# Patient Record
Sex: Female | Born: 1980 | Race: White | Hispanic: No | Smoking: Never smoker
Health system: Southern US, Community
[De-identification: ages and names within clinical notes are randomized; demographics above are authoritative.]

## PROBLEM LIST (undated history)

## (undated) DIAGNOSIS — I10 Essential (primary) hypertension: Secondary | ICD-10-CM

## (undated) DIAGNOSIS — F419 Anxiety disorder, unspecified: Secondary | ICD-10-CM

## (undated) DIAGNOSIS — C801 Malignant (primary) neoplasm, unspecified: Secondary | ICD-10-CM

## (undated) DIAGNOSIS — Z9889 Other specified postprocedural states: Secondary | ICD-10-CM

## (undated) DIAGNOSIS — R011 Cardiac murmur, unspecified: Secondary | ICD-10-CM

## (undated) DIAGNOSIS — F32A Depression, unspecified: Secondary | ICD-10-CM

## (undated) DIAGNOSIS — R112 Nausea with vomiting, unspecified: Secondary | ICD-10-CM

## (undated) DIAGNOSIS — J189 Pneumonia, unspecified organism: Secondary | ICD-10-CM

## (undated) HISTORY — PX: NECK DISSECTION: SUR422

## (undated) HISTORY — PX: THYROIDECTOMY: SHX17

---

## 2003-12-17 HISTORY — PX: CHOLECYSTECTOMY: SHX55

## 2007-11-18 ENCOUNTER — Ambulatory Visit: Payer: Self-pay | Admitting: Cardiology

## 2010-12-26 ENCOUNTER — Encounter
Admission: RE | Admit: 2010-12-26 | Discharge: 2010-12-26 | Payer: Self-pay | Source: Home / Self Care | Attending: Surgery | Admitting: Surgery

## 2011-01-22 ENCOUNTER — Encounter (HOSPITAL_COMMUNITY)
Admission: RE | Admit: 2011-01-22 | Discharge: 2011-01-22 | Disposition: A | Discharge status: Home / Self Care | Payer: PRIVATE HEALTH INSURANCE | Source: Ambulatory Visit | Attending: Otolaryngology | Admitting: Otolaryngology

## 2011-01-22 ENCOUNTER — Other Ambulatory Visit (HOSPITAL_COMMUNITY): Payer: Self-pay | Admitting: Otolaryngology

## 2011-01-22 ENCOUNTER — Ambulatory Visit (HOSPITAL_COMMUNITY)
Admission: RE | Admit: 2011-01-22 | Discharge: 2011-01-22 | Disposition: A | Discharge status: Home / Self Care | Payer: PRIVATE HEALTH INSURANCE | Source: Ambulatory Visit | Attending: Otolaryngology | Admitting: Otolaryngology

## 2011-01-22 DIAGNOSIS — E0590 Thyrotoxicosis, unspecified without thyrotoxic crisis or storm: Secondary | ICD-10-CM

## 2011-01-22 DIAGNOSIS — Z01818 Encounter for other preprocedural examination: Secondary | ICD-10-CM | POA: Insufficient documentation

## 2011-01-22 LAB — SURGICAL PCR SCREEN
MRSA, PCR: NEGATIVE
Staphylococcus aureus: NEGATIVE

## 2011-01-22 LAB — CBC
Hemoglobin: 13.7 g/dL (ref 12.0–15.0)
MCH: 30.9 pg (ref 26.0–34.0)
MCHC: 34.3 g/dL (ref 30.0–36.0)
Platelets: 350 10*3/uL (ref 150–400)
RBC: 4.44 MIL/uL (ref 3.87–5.11)

## 2011-01-22 LAB — BASIC METABOLIC PANEL
BUN: 10 mg/dL (ref 6–23)
Creatinine, Ser: 0.68 mg/dL (ref 0.4–1.2)
GFR calc Af Amer: >60 mL/min (ref >60)
GFR calc non Af Amer: >60 mL/min (ref >60)
Potassium: 4.4 mEq/L (ref 3.5–5.1)

## 2011-01-22 LAB — HCG, SERUM, QUALITATIVE: Preg, Serum: NEGATIVE

## 2011-01-24 ENCOUNTER — Inpatient Hospital Stay (HOSPITAL_COMMUNITY)
Admission: RE | Admit: 2011-01-24 | Discharge: 2011-01-28 | DRG: 626 | Disposition: A | Discharge status: Home / Self Care | Payer: PRIVATE HEALTH INSURANCE | Attending: Otolaryngology | Admitting: Otolaryngology

## 2011-01-24 ENCOUNTER — Other Ambulatory Visit: Payer: Self-pay | Admitting: Otolaryngology

## 2011-01-24 DIAGNOSIS — I10 Essential (primary) hypertension: Secondary | ICD-10-CM | POA: Diagnosis present

## 2011-01-24 DIAGNOSIS — C50919 Malignant neoplasm of unspecified site of unspecified female breast: Secondary | ICD-10-CM | POA: Diagnosis present

## 2011-01-24 DIAGNOSIS — C73 Malignant neoplasm of thyroid gland: Principal | ICD-10-CM | POA: Diagnosis present

## 2011-01-25 LAB — CALCIUM
Calcium: 8.4 mg/dL (ref 8.4–10.5)
Calcium: 8.4 mg/dL (ref 8.4–10.5)

## 2011-01-25 LAB — CBC
MCH: 29.8 pg (ref 26.0–34.0)
MCV: 89.2 fL (ref 78.0–100.0)
RDW: 13.3 % (ref 11.5–15.5)

## 2011-01-26 LAB — CALCIUM
Calcium: 8.4 mg/dL (ref 8.4–10.5)
Calcium: 8.3 mg/dL — ABNORMAL LOW (ref 8.4–10.5)

## 2011-01-28 NOTE — Op Note (Signed)
Sabrina [name], Sabrina [name]                ACCOUNT NO.:  [id number]  MEDICAL RECORD NO.:  [id number]           PATIENT TYPE:  I  LOCATION:  5531                         FACILITY:  [facility]  PHYSICIAN:  Kaylob Wallen H. 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  PROCEDURE:  Total thyroidectomy, anterior compartment dissection, and right modified radical neck dissection including levels 2 through 5.  General endotracheal anesthesia was used.  No complications.  Blood loss 50 mL.  FINDINGS:  Multiple hard nodules within both lobes of the thyroid more so on the right.  Multiple large firm nodes on the right including agglomeration of large nodes in the supraclavicular fossa and multiple nodes up along the jugular chain in levels 2 through 4.  HISTORY:  A 30 year old presented with a right lower neck mass.  CT evaluation, FNA, and ultrasound revealed metastatic papillary thyroid carcinoma.  Risks, benefits, alternative, and complications of procedure were explained to the patient who seemed understand and agreed to surgery.  PROCEDURE:  The patient was taken to the operating room and placed on the operating table in supine position.  Following induction of general endotracheal anesthesia, the patient was prepped and draped in a standard fashion.  A marking pen was used to outline an incision transverse just above the clavicle with an extension up to the right mastoid tip.  The electrocautery was used to incise the skin and subcutaneous tissue and then through the platysma layer.  Subplatysmal flaps were developed superiorly to the mandible and inferiorly to the clavicle. 1. Total thyroidectomy.  The left lobe was dissected first.   The lobe     was retracted medially while the superior pole was exposed.  The     strap muscles were reflected laterally.  The superior vasculature     was ligated between clamps and divided.  The dissection remained     right on the capsule of the thyroid.  Parathyroids were not     separately identified.  The recurrent nerve was also not identified     during the dissection.  The middle thyroid vein was ligated between     clamps and divided.  The inferior vasculature was also ligated     between clamps and divided and gland was brought forward.  The     ligament of Allyson Sabal was divided using electrocautery, and the gland     was brought off the trachea.  A similar dissection was then     accomplished on the right side.  The right side had much larger     firm nodules including one deep one that extended back into the     region where the recurrent nerve would normally sit as it enters     the larynx.  I did not see the nerve in this area but further     inspection after the lobe was  dissected out revealed what appeared     to be the nerve in its normal location and intact.  Again,     parathyroids were not separately identified on this side.  The     thyroid was sent for pathologic evaluation with a suture marking     the right lobe.  1. Anterior compartment dissection.  After the thyroidectomy was     completed, the anterior compartment was palpated and there was 1 or     2 enlarged nodes that were palpable.  The dissection was then     accomplished taking care to ligate any vessels that were in the     region between clamps and then dividing them.  The dissection     stayed anterior to the trachea to protect the recurrent nerves and     also the vasculature of the parathyroids.  The fibrofatty tissue     with lymph nodes was then dissected out and sent for pathologic     evaluation, marked as central node compartment dissection.  1. Right modified radical neck dissection.  The right  neck dissection     was then accomplished.  The dissection encompassed the fibrofatty     lymph node bearing tissue involving the levels 2 through 5.  The     following structures were identified and preserved:  The common     carotid, external and internal carotids, the vagus nerve, the     internal jugular vein, the spinal accessory nerve, and the     hypoglossal nerve.  The omohyoid muscle was transected and the     external jugular vein wire was removed.  What appeared to be a     lymphatic duct in the lower jugular was identified, ligated between     clamps, and divided.  Complete dissection was accomplished.  There     were multiple lymph nodes identified.  The dissection was brought     off the strap muscles anteriorly, off the clavicle inferiorly, and     removing the supraclavicular nodes involved significant traction on     the fatty tissue and the supraclavicular fat fossa that may have     included some axillary fat.  The superior limit of dissection was     the submandibular gland and the mastoid tip and angle of the     mandible.  The posterosuperior limits were the splenius capitis and     levator scapula.  Posterior limit was the upper level 5 fibrofatty     tissue where there were no nodes palpable and inferiorly was the     fascia of the scalene muscles and the associated brachial plexus     nerves.  The specimen was sent in 2 parts, one part being the     levels 2, 3, and 4 that were marked with marking sutures on levels     2 and 3 and a separate specimen from level 5 with the large mass of     nodes.  The wound was irrigated with saline.  There was no evidence     of chyle leak.  A 15-French round JP drain was left in the wound,     exited through a separate stab incision, and secured in place with     a nylon suture.  The wound was closed with 3-0 chromic in the     platysma layer and staples on the skin.  Bacitracin was applied.  The patient was then awakened,  extubated, and transferred to     recovery in stable condition.     Michalla Ringer H. Pollyann Kennedy, MD     JHR/MEDQ  D:  01/24/2011  T:  01/25/2011  Job:  [id number]  Electronically Signed by Serena Colonel MD on 01/28/2011 09:36:46 PM

## 2011-02-05 NOTE — Discharge Summary (Signed)
  [name], [name]                ACCOUNT NO.:  [id number]  MEDICAL RECORD NO.:  [id number]           PATIENT TYPE:  I  LOCATION:  5531                         FACILITY:  [facility]  PHYSICIAN:  Loyalty Arentz H. Pollyann Kennedy, MD     DATE OF BIRTH:  Aug 30, 1981  DATE OF ADMISSION:  01/24/2011 DATE OF DISCHARGE:  01/28/2011                              DISCHARGE SUMMARY   ADMISSION DIAGNOSIS:  Metastatic papillary thyroid carcinoma.  DISCHARGE DIAGNOSES:  Status post right modified radical neck dissection and total thyroidectomy with central node compartment dissection.  HISTORY:  A 30 year old was found to have a metastatic papillary thyroid carcinoma and a supraclavicular node.  Imaging revealed diffuse adenopathy on the right and bilateral thyroid nodules.  The right side was a worse and there were calcifications as well.  She was admitted to the hospital on February 9 where she underwent the above-mentioned operative procedure.  She was transferred to the surgical floor postoperatively in good condition.  There was a drain left in place. Her hospital course was uncomplicated.  The drain was removed on the day of discharge.  Her voice remained normal.  Her swallowing ability was normal and her calcium was remained normal.  She is discharged to home with staples in her skin in good condition, instructed to keep the incision clean and dry and apply antibiotic ointment twice daily.  She will follow up with me at the end of the week for staple removal. Pathology was still pending at the time of discharge.     Sabrina [name] H. Pollyann Kennedy, MD     JHR/MEDQ  D:  01/28/2011  T:  01/29/2011  Job:  [id number]  Electronically Signed by Serena Colonel MD on 02/05/2011 11:08:48 AM

## 2011-02-18 ENCOUNTER — Other Ambulatory Visit (HOSPITAL_COMMUNITY): Payer: Self-pay | Admitting: Endocrinology

## 2011-02-18 DIAGNOSIS — C73 Malignant neoplasm of thyroid gland: Secondary | ICD-10-CM

## 2011-03-04 ENCOUNTER — Encounter (HOSPITAL_COMMUNITY)
Admission: RE | Admit: 2011-03-04 | Discharge: 2011-03-04 | Disposition: A | Discharge status: Home / Self Care | Payer: PRIVATE HEALTH INSURANCE | Source: Ambulatory Visit | Attending: Endocrinology | Admitting: Endocrinology

## 2011-03-04 DIAGNOSIS — C73 Malignant neoplasm of thyroid gland: Secondary | ICD-10-CM | POA: Insufficient documentation

## 2011-03-05 ENCOUNTER — Ambulatory Visit (HOSPITAL_COMMUNITY): Payer: PRIVATE HEALTH INSURANCE

## 2011-03-06 ENCOUNTER — Ambulatory Visit (HOSPITAL_COMMUNITY)
Admission: RE | Admit: 2011-03-06 | Discharge: 2011-03-06 | Disposition: A | Discharge status: Home / Self Care | Payer: PRIVATE HEALTH INSURANCE | Source: Ambulatory Visit | Attending: Endocrinology | Admitting: Endocrinology

## 2011-03-06 LAB — HCG, SERUM, QUALITATIVE: Preg, Serum: NEGATIVE

## 2011-03-06 MED ORDER — SODIUM IODIDE I 131 CAPSULE: 130.0000 | Freq: Once | INTRAVENOUS | Status: AC | PRN

## 2011-03-15 ENCOUNTER — Encounter (HOSPITAL_COMMUNITY)
Admission: RE | Admit: 2011-03-15 | Discharge: 2011-03-15 | Disposition: A | Discharge status: Home / Self Care | Payer: PRIVATE HEALTH INSURANCE | Source: Ambulatory Visit | Attending: Endocrinology | Admitting: Endocrinology

## 2011-03-15 ENCOUNTER — Encounter (HOSPITAL_COMMUNITY): Payer: Self-pay

## 2011-03-15 DIAGNOSIS — C73 Malignant neoplasm of thyroid gland: Secondary | ICD-10-CM | POA: Insufficient documentation

## 2011-03-15 HISTORY — DX: Malignant (primary) neoplasm, unspecified: C80.1

## 2013-10-18 ENCOUNTER — Other Ambulatory Visit (HOSPITAL_COMMUNITY): Payer: Self-pay | Admitting: Endocrinology

## 2013-10-18 DIAGNOSIS — C73 Malignant neoplasm of thyroid gland: Secondary | ICD-10-CM

## 2013-10-26 ENCOUNTER — Other Ambulatory Visit (HOSPITAL_COMMUNITY): Payer: PRIVATE HEALTH INSURANCE

## 2013-10-27 ENCOUNTER — Encounter (HOSPITAL_COMMUNITY)
Admission: RE | Admit: 2013-10-27 | Discharge: 2013-10-27 | Disposition: A | Discharge status: Home / Self Care | Payer: PRIVATE HEALTH INSURANCE | Source: Ambulatory Visit | Attending: Endocrinology | Admitting: Endocrinology

## 2013-10-27 DIAGNOSIS — C73 Malignant neoplasm of thyroid gland: Secondary | ICD-10-CM | POA: Insufficient documentation

## 2013-10-27 IMAGING — PT NM PET TUM IMG RESTAG (PS) SKULL BASE T - THIGH
6 series · 25 of 25 positions shown · non-contrast
Comparison: Whole-body scan iodine [DATE]

CLINICAL DATA: Subsequent treatment strategy for thyroid
carcinoma.. Patient with papillary thyroid carcinoma. Patient
received adjuvant radiotherapy in [DATE] with 130 mCi I 131.

EXAM:
NUCLEAR MEDICINE PET SKULL BASE TO THIGH
FASTING BLOOD GLUCOSE:  Value: 101mg/dl
TECHNIQUE: 17.7 mCi F-18 FDG was injected intravenously. CT data was obtained
and used for attenuation correction and anatomic localization only.
(This was not acquired as a diagnostic CT examination.) Additional
exam technical data entered on technologist worksheet.

[Series 1: pet ac · axial · 3.3mm · 4.69mm/px · z∈[-928,-58]mm · 5 of 267 slices shown]
[im 1/267]
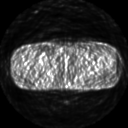
[im 67/267]
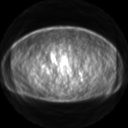
[im 134/267]
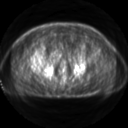
[im 200/267]
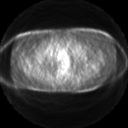
[im 267/267]
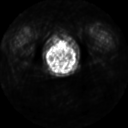

[Series 2: ct images · axial · 3.8mm · 0.98mm/px · z∈[-928,-58]mm · 5 of 261 slices shown]
[im 1/261]
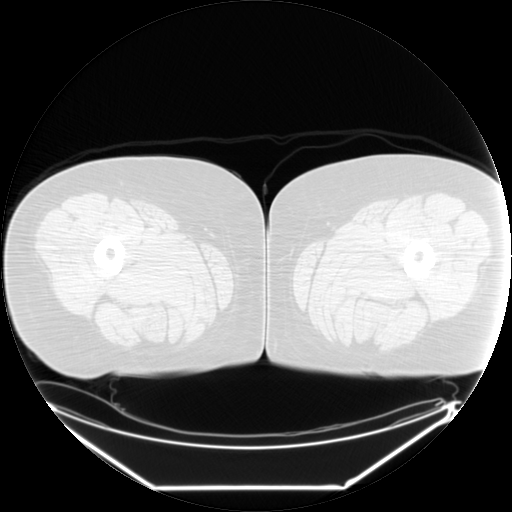
[im 66/261]
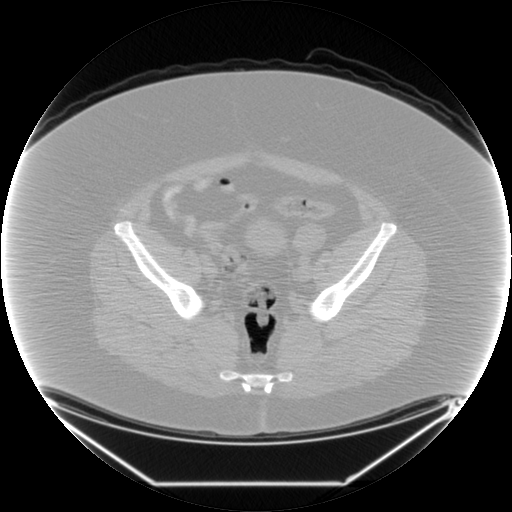
[im 131/261]
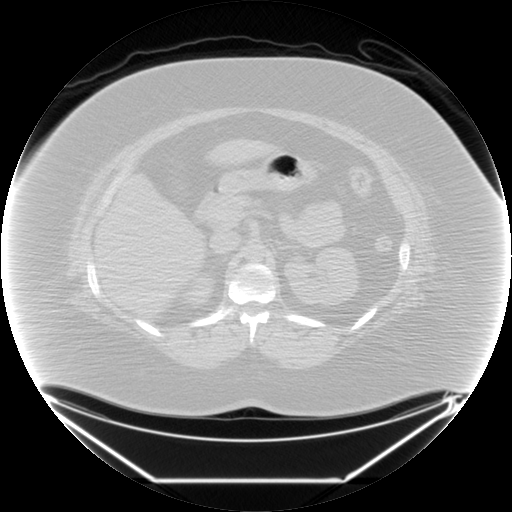
[im 196/261]
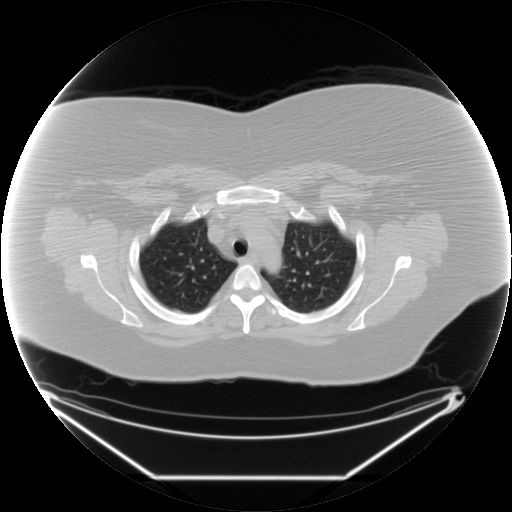
[im 261/261  brain]
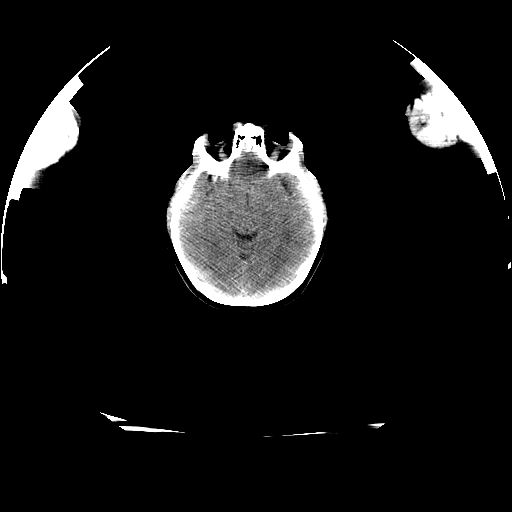

[Series 2: pet nac · axial · 3.3mm · 4.69mm/px · z∈[-928,-58]mm · 6 of 267 slices shown]
[im 1/267]
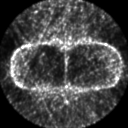
[im 54/267]
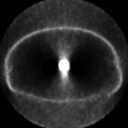
[im 107/267]
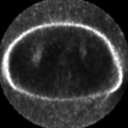
[im 160/267]
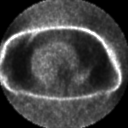
[im 213/267]
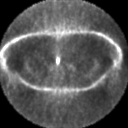
[im 267/267]
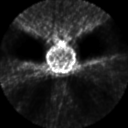

[Series 123: mip · coronal · 3.3mm · 4.69mm/px · 1 of 30 slices shown]
[im 1/30]
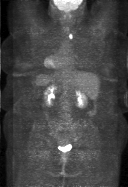

[Series 151: reformatted · axial · 3.3mm · 3.91mm/px · z∈[-928,-58]mm · 6 of 265 slices shown (1 of 2)]
[im 1/265]
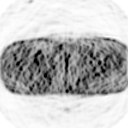
[im 53/265]
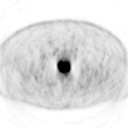
[im 106/265]
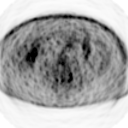
[im 159/265]
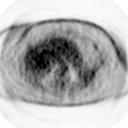
[im 212/265]
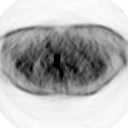
[im 265/265]
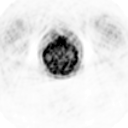

[Series 153: reformatted · coronal · 4.7mm · 6.98mm/px · 2 of 85 slices shown (2 of 2)]
[im 1/85]
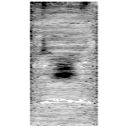
[im 85/85]
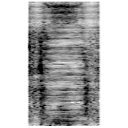

[25 of 25 positions shown; findings below may reference images not displayed]

FINDINGS: NECK

There is a single intense hypermetabolic nodule in the lower right
neck just inferior to the thyroid bed and behind the medial aspect
of the right clavicle. On the CT portion nodule measures 14 x 22 mm
(image 56). This nodule has intense metabolic activity within show
CT max 26.0 There are no additional hypermetabolic nodules in the
thyroid bed. No hypermetabolic cervical lymph nodes.

CHEST

No hypermetabolic mediastinal nodes. No suspicious pulmonary
nodules.

ABDOMEN/PELVIS

No abnormal hypermetabolic activity within the liver, pancreas,
adrenal glands, or spleen. No hypermetabolic lymph nodes in the
abdomen or pelvis.

SKELETON

No focal hypermetabolic activity to suggest skeletal metastasis.
IMPRESSION: 1. Hypermetabolic 2.2 x 1.4 cm nodule inferior to the right thyroid
bed is consistent with thyroid cancer recurrence. This nodule would
likely be amenable to resection.

2. No additional evidence of thyroid cancer metastasis on the
whole-body scan.

## 2013-10-27 MED ORDER — FLUDEOXYGLUCOSE F - 18 (FDG) INJECTION
17.7000 | Freq: Once | INTRAVENOUS | Status: AC | PRN
  Administered 2013-10-27: 17.7 via INTRAVENOUS

## 2013-11-15 NOTE — H&P (Signed)
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 Discussed  Status post total thyroidectomy and right neck dissection about 2 years ago for papillary cancer, there was a positive margin inferiorly on the right thyroid and multiple positive nodes. She had treatment with radioactive iodine. She has had low-level thyroglobulin and on recent PET scan was found to have a positive node in the right peritracheal region. Iodine scans have been negative. She has no symptoms. On examination, her voice is normal. Cords move well. No palpable neck masses. I have reviewed the PET scan. This is amenable to surgical resection however the recurrent nerve is going to be at high risk. We had a lengthy discussion about what that means. Nonetheless, surgical excision is the recommended treatment. We will set this up at her convenience. Reason For Visit  Possible thyroid cancer. Allergies  Penicillins Sulfa Drugs. Current Meds  Metoprolol Tartrate TABS;; RPT Voltaren TBEC (Diclofenac Sodium);; RPT Levothyroxine Sodium TABS;; RPT Citalopram Hydrobromide TABS;; RPT. Active Problems  Hypertension   (401.9) (I10) MALIG NEO LYMPH NODES*   (196) MALIGN NEOPL THYROID   (193). Papillary thyroid carcinoma (193) (C73). PSH  Cesarean Section Cholecystectomy Thyroid Surgery 2012;  * with right neck dissection, 01 Nov 2013. Family Hx  Family history of cardiac disorder: Father,Grandfather (V17.49) (Z82.49) Family history of hearing loss: Father,Grandmother (V19.2) (Z82.2) Family history of hypertension: Father,Grandfather (V17.49) (Z82.49) No pertinent family history: Mother. Personal Hx  Caffeine use (V49.89) (F15.929); 3 cups daily Never smoker (V49.89) (Z78.9) No alcohol use. ROS  Systemic: Feeling tired (fatigue).  No fever.  Night sweats.  No recent weight loss. Head: Headache. Eyes: No eye  symptoms. Otolaryngeal: No hearing loss, no earache, no tinnitus, and no purulent nasal discharge.  No nasal passage blockage (stuffiness).  Snoring.  No sneezing, no hoarseness, and no sore throat. Cardiovascular: No chest pain or discomfort  and no palpitations. Pulmonary: No dyspnea, no cough, and no wheezing. Gastrointestinal: No dysphagia  and no heartburn.  No nausea, no abdominal pain, and no melena.  No diarrhea. Genitourinary: No dysuria. Endocrine: No muscle weakness. Musculoskeletal: No calf muscle cramps, no arthralgias, and no soft tissue swelling. Neurological: No dizziness, no fainting, no tingling, and no numbness. Psychological: Anxiety  and depression. Skin: No rash. Vital Signs   Recorded by [facility] on 01 Nov 2013 03:19 PM BP:140/80,  Height: 64 in, Weight: 328 lb , BMI: 56.3 kg/m2,  BMI Calculated: 56.30 ,  BSA Calculated: 2.41. Signature  Electronically signed by : Serena Colonel  M.D.; 11/02/2013 9:01 AM EST. Electronically signed by : Serena Colonel  M.D.; 11/08/2013 3:43 PM EST.

## 2013-11-18 ENCOUNTER — Encounter (HOSPITAL_COMMUNITY): Payer: Self-pay

## 2013-11-18 ENCOUNTER — Encounter (HOSPITAL_COMMUNITY)
Admission: RE | Admit: 2013-11-18 | Discharge: 2013-11-18 | Disposition: A | Discharge status: Home / Self Care | Payer: PRIVATE HEALTH INSURANCE | Source: Ambulatory Visit | Attending: Otolaryngology | Admitting: Otolaryngology

## 2013-11-18 DIAGNOSIS — Z01818 Encounter for other preprocedural examination: Secondary | ICD-10-CM | POA: Insufficient documentation

## 2013-11-18 DIAGNOSIS — Z01812 Encounter for preprocedural laboratory examination: Secondary | ICD-10-CM | POA: Insufficient documentation

## 2013-11-18 HISTORY — DX: Anxiety disorder, unspecified: F41.9

## 2013-11-18 LAB — BASIC METABOLIC PANEL
Calcium: 9.1 mg/dL (ref 8.4–10.5)
Chloride: 104 mEq/L (ref 96–112)
Creatinine, Ser: 0.65 mg/dL (ref 0.50–1.10)
GFR calc Af Amer: >90 mL/min (ref >90)
GFR calc non Af Amer: >90 mL/min (ref >90)
Sodium: 140 mEq/L (ref 135–145)

## 2013-11-18 LAB — CBC
HCT: 40.2 % (ref 36.0–46.0)
MCHC: 33.3 g/dL (ref 30.0–36.0)
Platelets: 343 10*3/uL (ref 150–400)
RBC: 4.43 MIL/uL (ref 3.87–5.11)
RDW: 12.9 % (ref 11.5–15.5)
WBC: 7.4 10*3/uL (ref 4.0–10.5)

## 2013-11-18 NOTE — Pre-Procedure Instructions (Signed)
Darlen Gledhill Khiev  11/18/2013   Your procedure is scheduled on:  Wednesday November 24, 2013.  Report to  [facility] Short Stay Entrance "A" Admitting at 6:30 AM.  Call this number if you have problems the morning of surgery: [phone]   Remember:  STOP NSAIDS(VOLTAREN), VITAMINS, AND HERBAL MEDICATIONS 5 DAYS PRIOR TO SURGERY.   Do not eat food or drink liquids after midnight.   Take these medicines the morning of surgery with A SIP OF WATER: None   Do not wear jewelry, make-up or nail polish.  Do not wear lotions, powders, or perfumes. You may wear deodorant.  Do not shave 48 hours prior to surgery.   Do not bring valuables to the hospital.  [facility] is not responsible for any belongings or valuables.               Contacts, dentures or bridgework may not be worn into surgery.  Leave suitcase in the car. After surgery it may be brought to your room.  For patients admitted to the hospital, discharge time is determined by your treatment team.               Patients discharged the day of surgery will not be allowed to drive home.  Name and phone number of your driver: Family/Friend  Special Instructions: Shower using CHG 2 nights before surgery and the night before surgery.  If you shower the day of surgery use CHG.  Use special wash - you have one bottle of CHG for all showers.  You should use approximately 1/3 of the bottle for each shower.   Please read over the following fact sheets that you were given: Pain Booklet, Coughing and Deep Breathing and Surgical Site Infection Prevention

## 2013-11-23 MED ORDER — VANCOMYCIN HCL 10 G IV SOLR
1500.0000 mg | INTRAVENOUS | Status: AC
  Administered 2013-11-24: 1500 mg via INTRAVENOUS
  Filled 2013-11-23: qty 1500

## 2013-11-24 ENCOUNTER — Ambulatory Visit (HOSPITAL_COMMUNITY): Payer: PRIVATE HEALTH INSURANCE | Admitting: Anesthesiology

## 2013-11-24 ENCOUNTER — Encounter (HOSPITAL_COMMUNITY)
Admission: RE | Disposition: A | Discharge status: Home / Self Care | Payer: Self-pay | Source: Ambulatory Visit | Attending: Otolaryngology

## 2013-11-24 ENCOUNTER — Encounter (HOSPITAL_COMMUNITY): Payer: PRIVATE HEALTH INSURANCE | Admitting: Anesthesiology

## 2013-11-24 ENCOUNTER — Observation Stay (HOSPITAL_COMMUNITY)
Admission: RE | Admit: 2013-11-24 | Discharge: 2013-11-25 | Disposition: A | Discharge status: Home / Self Care | Payer: PRIVATE HEALTH INSURANCE | Source: Ambulatory Visit | Attending: Otolaryngology | Admitting: Otolaryngology

## 2013-11-24 ENCOUNTER — Encounter (HOSPITAL_COMMUNITY): Payer: Self-pay | Admitting: *Deleted

## 2013-11-24 DIAGNOSIS — I10 Essential (primary) hypertension: Secondary | ICD-10-CM | POA: Insufficient documentation

## 2013-11-24 DIAGNOSIS — Z789 Other specified health status: Secondary | ICD-10-CM | POA: Insufficient documentation

## 2013-11-24 DIAGNOSIS — E049 Nontoxic goiter, unspecified: Principal | ICD-10-CM | POA: Insufficient documentation

## 2013-11-24 DIAGNOSIS — Z9089 Acquired absence of other organs: Secondary | ICD-10-CM | POA: Insufficient documentation

## 2013-11-24 DIAGNOSIS — C73 Malignant neoplasm of thyroid gland: Secondary | ICD-10-CM | POA: Diagnosis present

## 2013-11-24 DIAGNOSIS — Z880 Allergy status to penicillin: Secondary | ICD-10-CM | POA: Insufficient documentation

## 2013-11-24 DIAGNOSIS — C770 Secondary and unspecified malignant neoplasm of lymph nodes of head, face and neck: Secondary | ICD-10-CM | POA: Insufficient documentation

## 2013-11-24 HISTORY — DX: Pneumonia, unspecified organism: J18.9

## 2013-11-24 HISTORY — DX: Nausea with vomiting, unspecified: R11.2

## 2013-11-24 HISTORY — DX: Cardiac murmur, unspecified: R01.1

## 2013-11-24 HISTORY — DX: Essential (primary) hypertension: I10

## 2013-11-24 HISTORY — PX: MASS EXCISION: SHX2000

## 2013-11-24 HISTORY — DX: Other specified postprocedural states: Z98.890

## 2013-11-24 LAB — CALCIUM
Calcium: 8.6 mg/dL (ref 8.4–10.5)
Calcium: 8.3 mg/dL — ABNORMAL LOW (ref 8.4–10.5)

## 2013-11-24 SURGERY — EXCISION MASS
Anesthesia: General | Site: Neck | Laterality: Right

## 2013-11-24 MED ORDER — OXYCODONE HCL 5 MG PO TABS: 5.0000 mg | ORAL_TABLET | Freq: Once | ORAL | Status: DC | PRN

## 2013-11-24 MED ORDER — PROMETHAZINE HCL 25 MG/ML IJ SOLN
6.2500 mg | INTRAMUSCULAR | Status: DC | PRN
  Administered 2013-11-24: 6.25 mg via INTRAVENOUS

## 2013-11-24 MED ORDER — ROCURONIUM BROMIDE 100 MG/10ML IV SOLN
INTRAVENOUS | Status: DC | PRN
  Administered 2013-11-24: 50 mg via INTRAVENOUS

## 2013-11-24 MED ORDER — OXYCODONE HCL 5 MG/5ML PO SOLN: 5.0000 mg | Freq: Once | ORAL | Status: DC | PRN

## 2013-11-24 MED ORDER — NEOSTIGMINE METHYLSULFATE 1 MG/ML IJ SOLN
INTRAMUSCULAR | Status: DC | PRN
  Administered 2013-11-24: 3 mg via INTRAVENOUS

## 2013-11-24 MED ORDER — PROMETHAZINE HCL 25 MG PO TABS: 25.0000 mg | ORAL_TABLET | Freq: Four times a day (QID) | ORAL | Status: DC | PRN

## 2013-11-24 MED ORDER — PROMETHAZINE HCL 25 MG/ML IJ SOLN
INTRAMUSCULAR | Status: AC
  Filled 2013-11-24: qty 1

## 2013-11-24 MED ORDER — CLONAZEPAM 0.5 MG PO TABS: 0.2500 mg | ORAL_TABLET | Freq: Two times a day (BID) | ORAL | Status: DC | PRN

## 2013-11-24 MED ORDER — ONDANSETRON HCL 4 MG/2ML IJ SOLN
INTRAMUSCULAR | Status: DC | PRN
  Administered 2013-11-24: 4 mg via INTRAVENOUS

## 2013-11-24 MED ORDER — HYDROMORPHONE HCL PF 1 MG/ML IJ SOLN: 0.2500 mg | INTRAMUSCULAR | Status: DC | PRN

## 2013-11-24 MED ORDER — FENTANYL CITRATE 0.05 MG/ML IJ SOLN
INTRAMUSCULAR | Status: DC | PRN
  Administered 2013-11-24: 50 ug via INTRAVENOUS
  Administered 2013-11-24: 100 ug via INTRAVENOUS
  Administered 2013-11-24 (×2): 50 ug via INTRAVENOUS

## 2013-11-24 MED ORDER — BACITRACIN ZINC 500 UNIT/GM EX OINT
TOPICAL_OINTMENT | CUTANEOUS | Status: AC
  Filled 2013-11-24: qty 15

## 2013-11-24 MED ORDER — 0.9 % SODIUM CHLORIDE (POUR BTL) OPTIME
TOPICAL | Status: DC | PRN
  Administered 2013-11-24: 1000 mL

## 2013-11-24 MED ORDER — DEXTROSE-NACL 5-0.9 % IV SOLN
INTRAVENOUS | Status: DC
  Administered 2013-11-24: 16:00:00 via INTRAVENOUS

## 2013-11-24 MED ORDER — LACTATED RINGERS IV SOLN
INTRAVENOUS | Status: DC | PRN
  Administered 2013-11-24 (×2): via INTRAVENOUS

## 2013-11-24 MED ORDER — CHOLECALCIFEROL 10 MCG (400 UNIT) PO TABS
800.0000 [IU] | ORAL_TABLET | Freq: Every day | ORAL | Status: DC
  Administered 2013-11-24 – 2013-11-25 (×2): 800 [IU] via ORAL
  Filled 2013-11-24 (×2): qty 2

## 2013-11-24 MED ORDER — HYDROCODONE-ACETAMINOPHEN 5-325 MG PO TABS
1.0000 | ORAL_TABLET | ORAL | Status: DC | PRN
  Administered 2013-11-24 – 2013-11-25 (×4): 2 via ORAL
  Filled 2013-11-24 (×4): qty 2

## 2013-11-24 MED ORDER — DICLOFENAC SODIUM 75 MG PO TBEC
75.0000 mg | DELAYED_RELEASE_TABLET | Freq: Two times a day (BID) | ORAL | Status: DC | PRN
  Filled 2013-11-24: qty 1

## 2013-11-24 MED ORDER — MIDAZOLAM HCL 5 MG/5ML IJ SOLN
INTRAMUSCULAR | Status: DC | PRN
  Administered 2013-11-24: 2 mg via INTRAVENOUS

## 2013-11-24 MED ORDER — LIDOCAINE-EPINEPHRINE 1 %-1:100000 IJ SOLN
INTRAMUSCULAR | Status: AC
  Filled 2013-11-24: qty 1

## 2013-11-24 MED ORDER — PROMETHAZINE HCL 25 MG RE SUPP: 25.0000 mg | Freq: Four times a day (QID) | RECTAL | Status: DC | PRN

## 2013-11-24 MED ORDER — PROPOFOL 10 MG/ML IV BOLUS
INTRAVENOUS | Status: DC | PRN
  Administered 2013-11-24: 150 mg via INTRAVENOUS

## 2013-11-24 MED ORDER — LIDOCAINE HCL (CARDIAC) 20 MG/ML IV SOLN
INTRAVENOUS | Status: DC | PRN
  Administered 2013-11-24: 50 mg via INTRAVENOUS

## 2013-11-24 MED ORDER — VITAMIN B-12 1000 MCG PO TABS
1000.0000 ug | ORAL_TABLET | Freq: Every day | ORAL | Status: DC
  Administered 2013-11-24 – 2013-11-25 (×2): 1000 ug via ORAL
  Filled 2013-11-24 (×2): qty 1

## 2013-11-24 MED ORDER — GLYCOPYRROLATE 0.2 MG/ML IJ SOLN
INTRAMUSCULAR | Status: DC | PRN
  Administered 2013-11-24: 0.4 mg via INTRAVENOUS

## 2013-11-24 MED ORDER — CITALOPRAM HYDROBROMIDE 20 MG PO TABS
20.0000 mg | ORAL_TABLET | Freq: Every day | ORAL | Status: DC
  Administered 2013-11-24 – 2013-11-25 (×2): 20 mg via ORAL
  Filled 2013-11-24 (×2): qty 1

## 2013-11-24 MED ORDER — LIDOCAINE-EPINEPHRINE 1 %-1:100000 IJ SOLN
INTRAMUSCULAR | Status: DC | PRN
  Administered 2013-11-24: 20 mL

## 2013-11-24 MED ORDER — LEVOTHYROXINE SODIUM 112 MCG PO TABS
224.0000 ug | ORAL_TABLET | ORAL | Status: DC
  Administered 2013-11-25: 224 ug via ORAL
  Filled 2013-11-24 (×2): qty 2

## 2013-11-24 MED ORDER — LEVOTHYROXINE SODIUM 125 MCG PO TABS: 250.0000 ug | ORAL_TABLET | ORAL | Status: DC

## 2013-11-24 SURGICAL SUPPLY — 57 items
APPLIER CLIP 9.375 SM OPEN (CLIP)
CANISTER SUCTION 2500CC (MISCELLANEOUS) ×2 IMPLANT
CLEANER TIP ELECTROSURG 2X2 (MISCELLANEOUS) ×2 IMPLANT
CLIP APPLIE 9.375 SM OPEN (CLIP) IMPLANT
CLOTH BEACON ORANGE TIMEOUT ST (SAFETY) IMPLANT
CONT SPEC 4OZ CLIKSEAL STRL BL (MISCELLANEOUS) IMPLANT
CORDS BIPOLAR (ELECTRODE) ×2 IMPLANT
COVER SURGICAL LIGHT HANDLE (MISCELLANEOUS) ×2 IMPLANT
DECANTER SPIKE VIAL GLASS SM (MISCELLANEOUS) ×2 IMPLANT
DERMABOND ADVANCED (GAUZE/BANDAGES/DRESSINGS) ×1
DERMABOND ADVANCED .7 DNX12 (GAUZE/BANDAGES/DRESSINGS) ×1 IMPLANT
DRAIN CHANNEL 15F RND FF W/TCR (WOUND CARE) IMPLANT
DRAIN SNY 10 ROU (WOUND CARE) ×2 IMPLANT
DRAPE INCISE 23X17 IOBAN STRL (DRAPES)
DRAPE INCISE IOBAN 23X17 STRL (DRAPES) IMPLANT
ELECT COATED BLADE 2.86 ST (ELECTRODE) ×2 IMPLANT
ELECT REM PT RETURN 9FT ADLT (ELECTROSURGICAL) ×2
ELECTRODE REM PT RTRN 9FT ADLT (ELECTROSURGICAL) ×1 IMPLANT
EVACUATOR SILICONE 100CC (DRAIN) ×2 IMPLANT
GAUZE SPONGE 4X4 16PLY XRAY LF (GAUZE/BANDAGES/DRESSINGS) ×2 IMPLANT
GLOVE BIO SURGEON STRL SZ 6.5 (GLOVE) ×2 IMPLANT
GLOVE BIOGEL PI IND STRL 6.5 (GLOVE) ×4 IMPLANT
GLOVE BIOGEL PI INDICATOR 6.5 (GLOVE) ×4
GLOVE ECLIPSE 7.5 STRL STRAW (GLOVE) ×2 IMPLANT
GLOVE SURG SS PI 6.5 STRL IVOR (GLOVE) ×4 IMPLANT
GOWN STRL NON-REIN LRG LVL3 (GOWN DISPOSABLE) ×10 IMPLANT
KIT BASIN OR (CUSTOM PROCEDURE TRAY) ×2 IMPLANT
KIT ROOM TURNOVER OR (KITS) ×2 IMPLANT
LOCATOR NERVE 3 VOLT (DISPOSABLE) IMPLANT
NEEDLE HYPO 25GX1X1/2 BEV (NEEDLE) ×2 IMPLANT
NS IRRIG 1000ML POUR BTL (IV SOLUTION) ×2 IMPLANT
PAD ARMBOARD 7.5X6 YLW CONV (MISCELLANEOUS) ×4 IMPLANT
PENCIL FOOT CONTROL (ELECTRODE) ×2 IMPLANT
PROBE NERVBE PRASS .33 (MISCELLANEOUS) ×2 IMPLANT
SOL PREP POV-IOD 4OZ 10% (MISCELLANEOUS) ×2 IMPLANT
SPECIMEN JAR MEDIUM (MISCELLANEOUS) IMPLANT
SPECIMEN JAR SMALL (MISCELLANEOUS) ×2 IMPLANT
SPONGE INTESTINAL PEANUT (DISPOSABLE) IMPLANT
SPONGE LAP 18X18 X RAY DECT (DISPOSABLE) IMPLANT
STAPLER VISISTAT 35W (STAPLE) ×2 IMPLANT
SUT CHROMIC 3 0 SH 27 (SUTURE) ×4 IMPLANT
SUT CHROMIC 5 0 P 3 (SUTURE) IMPLANT
SUT ETHILON 3 0 PS 1 (SUTURE) IMPLANT
SUT ETHILON 5 0 PS 2 18 (SUTURE) ×2 IMPLANT
SUT SILK 2 0 REEL (SUTURE) IMPLANT
SUT SILK 3 0 SH CR/8 (SUTURE) IMPLANT
SUT SILK 4 0 REEL (SUTURE) ×2 IMPLANT
SUT VIC AB 3-0 SH 18 (SUTURE) IMPLANT
TAPE CLOTH 4X10 WHT NS (GAUZE/BANDAGES/DRESSINGS) ×2 IMPLANT
TAPE HY-TAPE 1X5Y PINK NS LF (GAUZE/BANDAGES/DRESSINGS) ×2 IMPLANT
TOWEL OR 17X24 6PK STRL BLUE (TOWEL DISPOSABLE) ×2 IMPLANT
TOWEL OR 17X26 10 PK STRL BLUE (TOWEL DISPOSABLE) ×2 IMPLANT
TRAY ENT MC OR (CUSTOM PROCEDURE TRAY) ×2 IMPLANT
TRAY FOLEY CATH 14FRSI W/METER (CATHETERS) IMPLANT
TUBE ENDOTRAC EMG 7X10.2 (MISCELLANEOUS) ×2 IMPLANT
TUBE FEEDING 10FR FLEXIFLO (MISCELLANEOUS) IMPLANT
WATER STERILE IRR 1000ML POUR (IV SOLUTION) ×2 IMPLANT

## 2013-11-24 NOTE — Anesthesia Postprocedure Evaluation (Signed)
  Anesthesia Post-op Note  Patient: Sabrina [name]  Procedure(s) Performed: Procedure(s): EXCISION OF A RIGHT THYROID MASS (Right)  Patient Location: PACU  Anesthesia Type:General  Level of Consciousness: awake  Airway and Oxygen Therapy: Patient Spontanous Breathing  Post-op Pain: mild  Post-op Assessment: Post-op Vital signs reviewed  Post-op Vital Signs: stable  Complications: No apparent anesthesia complications

## 2013-11-24 NOTE — Interval H&P Note (Signed)
History and Physical Interval Note:  11/24/2013 8:25 AM  Sabrina [name]  has presented today for surgery, with the diagnosis of right thyroid mass  The various methods of treatment have been discussed with the patient and family. After consideration of risks, benefits and other options for treatment, the patient has consented to  Procedure(s): EXCISION OF A RIGHT THYROID MASS (Right) as a surgical intervention .  The patient's history has been reviewed, patient examined, no change in status, stable for surgery.  I have reviewed the patient's chart and labs.  Questions were answered to the patient's satisfaction.     Haadi Santellan

## 2013-11-24 NOTE — Anesthesia Preprocedure Evaluation (Signed)
Anesthesia Evaluation  Patient identified by MRN, date of birth, ID band Patient awake    Reviewed: Allergy & Precautions, H&P , NPO status   History of Anesthesia Complications Negative for: history of anesthetic complications  Airway Mallampati: II      Dental  (+) Teeth Intact   Pulmonary neg pulmonary ROS,  breath sounds clear to auscultation        Cardiovascular Rhythm:Regular Rate:Normal     Neuro/Psych negative neurological ROS     GI/Hepatic   Endo/Other  Morbid obesity  Renal/GU negative Renal ROS     Musculoskeletal   Abdominal (+) + obese,   Peds  Hematology   Anesthesia Other Findings   Reproductive/Obstetrics                           Anesthesia Physical Anesthesia Plan  ASA: II  Anesthesia Plan: General   Post-op Pain Management:    Induction: Intravenous  Airway Management Planned: Oral ETT  Additional Equipment:   Intra-op Plan:   Post-operative Plan: Extubation in OR  Informed Consent: I have reviewed the patients History and Physical, chart, labs and discussed the procedure including the risks, benefits and alternatives for the proposed anesthesia with the patient or authorized representative who has indicated his/her understanding and acceptance.   Dental advisory given  Plan Discussed with: CRNA and Surgeon  Anesthesia Plan Comments:         Anesthesia Quick Evaluation

## 2013-11-24 NOTE — Transfer of Care (Signed)
Immediate Anesthesia Transfer of Care Note  Patient: Sabrina [name]  Procedure(s) Performed: Procedure(s): EXCISION OF A RIGHT THYROID MASS (Right)  Patient Location: PACU  Anesthesia Type:General  Level of Consciousness: awake, alert , oriented and patient cooperative  Airway & Oxygen Therapy: Patient Spontanous Breathing and Patient connected to nasal cannula oxygen  Post-op Assessment: Report given to PACU RN, Post -op Vital signs reviewed and stable and Patient moving all extremities  Post vital signs: Reviewed and stable  Complications: No apparent anesthesia complications

## 2013-11-24 NOTE — Preoperative (Signed)
Beta Blockers   Reason not to administer Beta Blockers:Not Applicable 

## 2013-11-24 NOTE — Op Note (Signed)
OPERATIVE REPORT  DATE OF SURGERY: 11/24/2013  PATIENT:  Sabrina [name],  32 y.o. female  PRE-OPERATIVE DIAGNOSIS:  right thyroid mass  POST-OPERATIVE DIAGNOSIS:  right thyroid mass  PROCEDURE:  Procedure(s): EXCISION OF A RIGHT THYROID MASS  SURGEON:  Susy Frizzle, MD  ASSISTANTS: Aquilla Hacker, PA  ANESTHESIA:   General   EBL:  25 ml  DRAINS: 10 French round J-P  LOCAL MEDICATIONS USED:  None  SPECIMEN:  Right thyroid bed mass  COUNTS:  Correct  PROCEDURE DETAILS: The patient was taken to the operating room and placed on the operating table in the supine position. A shoulder roll was placed beneath the shoulder blades and the neck was extended. The neck was prepped and draped in a standard fashion. The previous incision scar was used for the new incision. Electrocautery was used to incise the skin and subcutaneous tissue. Subplatysmal flap was developed superiorly. Thyroid retractor was used throughout the case. There was dense scar tissue around the midline fascia. This was divided exposing the trachea. The scar tissue was then reflected laterally. The mass was easily palpable just adjacent to the right posterior inferior trachea above the clavicular head. The Nims monitor was used throughout the case to help identify recurrent nerve activity. The nerve itself was not identified as it was likely surrounded by the dense scar tissue. The mass was dissected free of surrounding tissue and sent for pathologic evaluation. No other masses were identified. Wound was irrigated with saline and suctioned. The drain was placed through separate stab incision. Midline fascia was reapproximated with chromic suture. The platysma layer was reapproximated. Subcuticular running closure with chromic suture and Dermabond skin closure was used. Patient was awakened extubated and transferred to recovery in stable condition .   PATIENT DISPOSITION:  To PACU, stable

## 2013-11-24 NOTE — Anesthesia Procedure Notes (Signed)
Procedure Name: Intubation Date/Time: 11/24/2013 8:40 AM Performed by: Jerilee Hoh Pre-anesthesia Checklist: Patient identified, Emergency Drugs available, Suction available and Patient being monitored Patient Re-evaluated:Patient Re-evaluated prior to inductionOxygen Delivery Method: Circle system utilized Preoxygenation: Pre-oxygenation with 100% oxygen Intubation Type: IV induction Ventilation: Mask ventilation without difficulty and Oral airway inserted - appropriate to patient size Laryngoscope Size: Mac and 3 Grade View: Grade I Tube type: Oral Tube size: 7.0 mm Number of attempts: 1 Airway Equipment and Method: Stylet Placement Confirmation: ETT inserted through vocal cords under direct vision,  positive ETCO2 and breath sounds checked- equal and bilateral Secured at: 22 cm Tube secured with: Tape Dental Injury: Teeth and Oropharynx as per pre-operative assessment

## 2013-11-24 NOTE — Progress Notes (Signed)
11/24/2013 6:49 PM  Capano, Carollee Herter [id number]  Post-Op Check    Temp:  [97.7 F (36.5 C)-98.6 F (37 C)] 98.6 F (37 C) (12/10 1818) Pulse Rate:  [74-85] 84 (12/10 1818) Resp:  [10-20] 16 (12/10 1818) BP: (123-138)/(63-84) 123/63 mmHg (12/10 1818) SpO2:  [90 %-100 %] 98 % (12/10 1818) Weight:  [146 kg (321 lb 14 oz)] 146 kg (321 lb 14 oz) (12/10 1417),     Intake/Output Summary (Last 24 hours) at 11/24/13 1849 Last data filed at 11/24/13 1838  Gross per 24 hour  Intake   1710 ml  Output    220 ml  Net   1490 ml   JP Drain 20 ml  Results for orders placed during the hospital encounter of 11/24/13 (from the past 24 hour(s))  CALCIUM     Status: None   Collection Time    11/24/13  4:10 PM      Result Value Range   Calcium 8.6  8.4 - 10.5 mg/dL    SUBJECTIVE:  Minimal pain, controlled.  Voice sl raspy.  No SOB.  Eating solid foods.  Voiding spontaneously.    OBJECTIVE:  Wound flat.  Drain functional.  Voice sl raspy, but not diplophonic.   IMPRESSION:  Satisfactory check  PLAN:  Ice, elevation, analgesia.  Await final path report.  Anticipate discharge in AM.  Lazarus Salines, Zola Button

## 2013-11-25 ENCOUNTER — Encounter (HOSPITAL_COMMUNITY): Payer: Self-pay | Admitting: Otolaryngology

## 2013-11-25 LAB — CALCIUM: Calcium: 8.1 mg/dL — ABNORMAL LOW (ref 8.4–10.5)

## 2013-11-25 MED ORDER — PROMETHAZINE HCL 25 MG RE SUPP: 25.0000 mg | Freq: Four times a day (QID) | RECTAL | Status: DC | PRN

## 2013-11-25 MED ORDER — HYDROCODONE-ACETAMINOPHEN 7.5-325 MG PO TABS: 1.0000 | ORAL_TABLET | Freq: Four times a day (QID) | ORAL | Status: DC | PRN

## 2013-11-25 NOTE — Progress Notes (Deleted)
Doing well. Staples removed. Tolerating liquids well. D/C home today. [employer] care teaching by myself.

## 2013-11-25 NOTE — Discharge Summary (Signed)
Physician Discharge Summary  Patient ID: Sabrina [name] MRN: [id number] DOB/AGE: May 21, 1981 32 y.o.  Admit date: 11/24/2013 Discharge date: 11/25/2013  Admission Diagnoses:Recurrrent papillary thyroid cancer.  Discharge Diagnoses:  Active Problems:   Papillary thyroid carcinoma   Discharged Condition: good  Hospital Course: No complications.  Consults: none  Significant Diagnostic Studies: none  Treatments: surgery: Right thyroid bed mass excision.  Discharge Exam: Blood pressure 116/57, pulse 71, temperature 98 F (36.7 C), temperature source Oral, resp. rate 16, height 5\' 4"  (1.626 m), weight 321 lb 14 oz (146 kg), last menstrual period 11/04/2013, SpO2 100.00%. PHYSICAL EXAM: Voice a little weak, no aspiration. Neck excellent. JP removed.  Disposition: 01-Home or Self Care  Discharge Orders   Future Orders Complete By Expires   Diet - low sodium heart healthy  As directed    Increase activity slowly  As directed        Medication List    TAKE these medications       citalopram 20 MG tablet  Commonly known as:  CELEXA  Take 20 mg by mouth daily.     clonazePAM 0.5 MG tablet  Commonly known as:  KLONOPIN  Take 0.25 mg by mouth 2 (two) times daily as needed for anxiety.     diclofenac 75 MG EC tablet  Commonly known as:  VOLTAREN  Take 75 mg by mouth 2 (two) times daily as needed for mild pain.     HYDROcodone-acetaminophen 7.5-325 MG per tablet  Commonly known as:  NORCO  Take 1 tablet by mouth every 6 (six) hours as needed for moderate pain.     levothyroxine 112 MCG tablet  Commonly known as:  SYNTHROID, LEVOTHROID  Take 224 mcg by mouth See admin instructions. *mondays through Friday*     levothyroxine 125 MCG tablet  Commonly known as:  SYNTHROID, LEVOTHROID  Take 250 mcg by mouth See admin instructions. Saturday and sunday     promethazine 25 MG suppository  Commonly known as:  PHENERGAN  Place 1 suppository (25 mg total) rectally every 6  (six) hours as needed for nausea or vomiting.     vitamin B-12 1000 MCG tablet  Commonly known as:  CYANOCOBALAMIN  Take 1,000 mcg by mouth daily.     VITAMIN D PO  Take 2 tablets by mouth daily.      ASK your doctor about these medications       MULTIVITAMIN PO  Take 1 tablet by mouth daily.           Follow-up Information   Follow up with Serena Colonel, MD. Schedule an appointment as soon as possible for a visit in 1 week.   Specialty:  Otolaryngology   Contact information:   [address] [place] [address] [phone]       Signed: Serena Colonel 11/25/2013, 8:26 AM

## 2013-11-25 NOTE — Progress Notes (Signed)
Discharge home. Home discharge instruction given, no question verbalized. 

## 2014-01-21 ENCOUNTER — Other Ambulatory Visit (HOSPITAL_COMMUNITY): Payer: Self-pay | Admitting: Endocrinology

## 2014-01-21 DIAGNOSIS — C73 Malignant neoplasm of thyroid gland: Secondary | ICD-10-CM

## 2014-01-31 ENCOUNTER — Encounter (HOSPITAL_COMMUNITY)
Admission: RE | Admit: 2014-01-31 | Discharge: 2014-01-31 | Disposition: A | Discharge status: Home / Self Care | Payer: PRIVATE HEALTH INSURANCE | Source: Ambulatory Visit | Attending: Endocrinology | Admitting: Endocrinology

## 2014-01-31 DIAGNOSIS — C73 Malignant neoplasm of thyroid gland: Secondary | ICD-10-CM

## 2014-01-31 MED ORDER — THYROTROPIN ALFA 1.1 MG IM SOLR
0.9000 mg | INTRAMUSCULAR | Status: AC
  Administered 2014-01-31: 0.9 mg via INTRAMUSCULAR
  Filled 2014-01-31: qty 0.9

## 2014-02-01 ENCOUNTER — Encounter (HOSPITAL_COMMUNITY)
Admission: RE | Admit: 2014-02-01 | Discharge: 2014-02-01 | Disposition: A | Discharge status: Home / Self Care | Payer: PRIVATE HEALTH INSURANCE | Source: Ambulatory Visit | Attending: Endocrinology | Admitting: Endocrinology

## 2014-02-01 MED ORDER — THYROTROPIN ALFA 1.1 MG IM SOLR
0.9000 mg | INTRAMUSCULAR | Status: AC
  Administered 2014-02-01: 0.9 mg via INTRAMUSCULAR
  Filled 2014-02-01: qty 0.9

## 2014-02-02 ENCOUNTER — Encounter (HOSPITAL_COMMUNITY)
Admission: RE | Admit: 2014-02-02 | Discharge: 2014-02-02 | Disposition: A | Discharge status: Home / Self Care | Payer: PRIVATE HEALTH INSURANCE | Source: Ambulatory Visit | Attending: Endocrinology | Admitting: Endocrinology

## 2014-02-02 ENCOUNTER — Encounter (HOSPITAL_COMMUNITY): Payer: Self-pay

## 2014-02-02 LAB — HCG, SERUM, QUALITATIVE: PREG SERUM: NEGATIVE

## 2014-02-02 MED ORDER — SODIUM IODIDE I 131 CAPSULE
191.3000 | Freq: Once | INTRAVENOUS | Status: AC | PRN
  Administered 2014-02-02: 191.3 via ORAL

## 2014-02-09 ENCOUNTER — Encounter (HOSPITAL_COMMUNITY)
Admission: RE | Admit: 2014-02-09 | Discharge: 2014-02-09 | Disposition: A | Discharge status: Home / Self Care | Payer: PRIVATE HEALTH INSURANCE | Source: Ambulatory Visit | Attending: Endocrinology | Admitting: Endocrinology

## 2014-02-09 DIAGNOSIS — C73 Malignant neoplasm of thyroid gland: Secondary | ICD-10-CM | POA: Insufficient documentation

## 2017-02-13 DIAGNOSIS — Z1389 Encounter for screening for other disorder: Secondary | ICD-10-CM | POA: Diagnosis not present

## 2017-02-13 DIAGNOSIS — Z6841 Body Mass Index (BMI) 40.0 and over, adult: Secondary | ICD-10-CM | POA: Diagnosis not present

## 2017-02-13 DIAGNOSIS — Z713 Dietary counseling and surveillance: Secondary | ICD-10-CM | POA: Diagnosis not present

## 2017-02-13 DIAGNOSIS — D497 Neoplasm of unspecified behavior of endocrine glands and other parts of nervous system: Secondary | ICD-10-CM | POA: Diagnosis not present

## 2017-02-13 DIAGNOSIS — Z0000 Encounter for general adult medical examination without abnormal findings: Secondary | ICD-10-CM | POA: Diagnosis not present

## 2017-02-13 DIAGNOSIS — E7800 Pure hypercholesterolemia, unspecified: Secondary | ICD-10-CM | POA: Diagnosis not present

## 2017-02-13 DIAGNOSIS — Z79899 Other long term (current) drug therapy: Secondary | ICD-10-CM | POA: Diagnosis not present

## 2017-02-13 DIAGNOSIS — F329 Major depressive disorder, single episode, unspecified: Secondary | ICD-10-CM | POA: Diagnosis not present

## 2017-02-13 DIAGNOSIS — Z299 Encounter for prophylactic measures, unspecified: Secondary | ICD-10-CM | POA: Diagnosis not present

## 2017-02-13 DIAGNOSIS — E039 Hypothyroidism, unspecified: Secondary | ICD-10-CM | POA: Diagnosis not present

## 2017-02-13 DIAGNOSIS — Z01419 Encounter for gynecological examination (general) (routine) without abnormal findings: Secondary | ICD-10-CM | POA: Diagnosis not present

## 2017-02-13 DIAGNOSIS — E669 Obesity, unspecified: Secondary | ICD-10-CM | POA: Diagnosis not present

## 2017-02-14 DIAGNOSIS — H5213 Myopia, bilateral: Secondary | ICD-10-CM | POA: Diagnosis not present

## 2017-07-08 ENCOUNTER — Encounter: Payer: Self-pay | Admitting: Physician Assistant

## 2017-07-08 ENCOUNTER — Ambulatory Visit: Payer: Self-pay | Admitting: Physician Assistant

## 2017-07-08 VITALS — BP 150/100 | HR 88 | Temp 98.7°F

## 2017-07-08 DIAGNOSIS — H1032 Unspecified acute conjunctivitis, left eye: Secondary | ICD-10-CM

## 2017-07-08 MED ORDER — TOBRAMYCIN 0.3 % OP SOLN: 2.0000 [drp] | OPHTHALMIC | 0 refills | Status: DC

## 2017-07-08 NOTE — Progress Notes (Signed)
150/100 

## 2017-07-08 NOTE — Progress Notes (Signed)
S:  C/o left eye being irritated, matted, sx started last pm, denies, cough, congestion, fever, chills, v/d; remainder ros neg, does not wear contacts  O: vitals wnl, nad, perrl eomi, left eye with injected conjunctiva, + drainage and matting noted at this time, tms clear, nasal mucosa inflamed, throat wnl, neck supple no lymph, lungs c t a, cv rrr  A:  Acute conjunctivitis  P: tobramycin opth gtts, return if not better in 3 - 5d, if worsening return earlier or see eye doctor, no work until eye is no longer red or draining

## 2017-07-09 ENCOUNTER — Other Ambulatory Visit: Payer: Self-pay | Admitting: Endocrinology

## 2017-07-09 DIAGNOSIS — E890 Postprocedural hypothyroidism: Secondary | ICD-10-CM | POA: Diagnosis not present

## 2017-07-09 DIAGNOSIS — C73 Malignant neoplasm of thyroid gland: Secondary | ICD-10-CM

## 2017-07-09 DIAGNOSIS — I10 Essential (primary) hypertension: Secondary | ICD-10-CM | POA: Diagnosis not present

## 2017-09-12 DIAGNOSIS — I10 Essential (primary) hypertension: Secondary | ICD-10-CM | POA: Diagnosis not present

## 2017-09-12 DIAGNOSIS — F329 Major depressive disorder, single episode, unspecified: Secondary | ICD-10-CM | POA: Diagnosis not present

## 2017-09-12 DIAGNOSIS — E669 Obesity, unspecified: Secondary | ICD-10-CM | POA: Diagnosis not present

## 2017-09-12 DIAGNOSIS — D497 Neoplasm of unspecified behavior of endocrine glands and other parts of nervous system: Secondary | ICD-10-CM | POA: Diagnosis not present

## 2017-09-12 DIAGNOSIS — Z789 Other specified health status: Secondary | ICD-10-CM | POA: Diagnosis not present

## 2017-09-12 DIAGNOSIS — Z6841 Body Mass Index (BMI) 40.0 and over, adult: Secondary | ICD-10-CM | POA: Diagnosis not present

## 2017-09-12 DIAGNOSIS — Z299 Encounter for prophylactic measures, unspecified: Secondary | ICD-10-CM | POA: Diagnosis not present

## 2017-09-12 DIAGNOSIS — R0602 Shortness of breath: Secondary | ICD-10-CM | POA: Diagnosis not present

## 2017-09-12 DIAGNOSIS — E039 Hypothyroidism, unspecified: Secondary | ICD-10-CM | POA: Diagnosis not present

## 2017-09-22 ENCOUNTER — Encounter (HOSPITAL_COMMUNITY)
Admission: RE | Admit: 2017-09-22 | Discharge: 2017-09-22 | Disposition: A | Discharge status: Home / Self Care | Payer: 59 | Source: Ambulatory Visit | Attending: Endocrinology | Admitting: Endocrinology

## 2017-09-22 DIAGNOSIS — C73 Malignant neoplasm of thyroid gland: Secondary | ICD-10-CM | POA: Diagnosis not present

## 2017-09-22 MED ORDER — THYROTROPIN ALFA 1.1 MG IM SOLR
0.9000 mg | INTRAMUSCULAR | Status: AC
  Administered 2017-09-22: 0.9 mg via INTRAMUSCULAR

## 2017-09-22 MED ORDER — THYROTROPIN ALFA 1.1 MG IM SOLR
INTRAMUSCULAR | Status: AC
  Filled 2017-09-22: qty 0.9

## 2017-09-23 ENCOUNTER — Encounter (HOSPITAL_COMMUNITY)
Admission: RE | Admit: 2017-09-23 | Discharge: 2017-09-23 | Disposition: A | Discharge status: Home / Self Care | Payer: 59 | Source: Ambulatory Visit | Attending: Endocrinology | Admitting: Endocrinology

## 2017-09-23 DIAGNOSIS — C73 Malignant neoplasm of thyroid gland: Secondary | ICD-10-CM | POA: Diagnosis not present

## 2017-09-23 MED ORDER — THYROTROPIN ALFA 1.1 MG IM SOLR
0.9000 mg | INTRAMUSCULAR | Status: AC
  Administered 2017-09-23: 0.9 mg via INTRAMUSCULAR

## 2017-09-24 ENCOUNTER — Encounter (HOSPITAL_COMMUNITY)
Admission: RE | Admit: 2017-09-24 | Discharge: 2017-09-24 | Disposition: A | Discharge status: Home / Self Care | Payer: 59 | Source: Ambulatory Visit | Attending: Endocrinology | Admitting: Endocrinology

## 2017-09-24 DIAGNOSIS — C73 Malignant neoplasm of thyroid gland: Secondary | ICD-10-CM | POA: Diagnosis not present

## 2017-09-24 LAB — HCG, SERUM, QUALITATIVE: Preg, Serum: NEGATIVE

## 2017-09-24 MED ORDER — SODIUM IODIDE I 131 CAPSULE
4.0000 | Freq: Once | INTRAVENOUS | Status: AC | PRN
  Administered 2017-09-24: 4 via ORAL

## 2017-09-26 ENCOUNTER — Encounter (HOSPITAL_COMMUNITY)
Admission: RE | Admit: 2017-09-26 | Discharge: 2017-09-26 | Disposition: A | Discharge status: Home / Self Care | Payer: 59 | Source: Ambulatory Visit | Attending: Endocrinology | Admitting: Endocrinology

## 2017-09-26 DIAGNOSIS — E890 Postprocedural hypothyroidism: Secondary | ICD-10-CM | POA: Diagnosis not present

## 2017-09-26 DIAGNOSIS — C73 Malignant neoplasm of thyroid gland: Secondary | ICD-10-CM | POA: Diagnosis not present

## 2017-09-26 DIAGNOSIS — Z85850 Personal history of malignant neoplasm of thyroid: Secondary | ICD-10-CM | POA: Diagnosis not present

## 2017-09-29 DIAGNOSIS — I10 Essential (primary) hypertension: Secondary | ICD-10-CM | POA: Insufficient documentation

## 2017-10-24 DIAGNOSIS — I10 Essential (primary) hypertension: Secondary | ICD-10-CM | POA: Diagnosis not present

## 2017-10-24 DIAGNOSIS — E039 Hypothyroidism, unspecified: Secondary | ICD-10-CM | POA: Diagnosis not present

## 2017-10-24 DIAGNOSIS — Z6841 Body Mass Index (BMI) 40.0 and over, adult: Secondary | ICD-10-CM | POA: Diagnosis not present

## 2017-10-24 DIAGNOSIS — D497 Neoplasm of unspecified behavior of endocrine glands and other parts of nervous system: Secondary | ICD-10-CM | POA: Diagnosis not present

## 2017-10-24 DIAGNOSIS — Z299 Encounter for prophylactic measures, unspecified: Secondary | ICD-10-CM | POA: Diagnosis not present

## 2018-02-13 DIAGNOSIS — Z299 Encounter for prophylactic measures, unspecified: Secondary | ICD-10-CM | POA: Diagnosis not present

## 2018-02-13 DIAGNOSIS — Z789 Other specified health status: Secondary | ICD-10-CM | POA: Diagnosis not present

## 2018-02-13 DIAGNOSIS — Z6841 Body Mass Index (BMI) 40.0 and over, adult: Secondary | ICD-10-CM | POA: Diagnosis not present

## 2018-02-13 DIAGNOSIS — I10 Essential (primary) hypertension: Secondary | ICD-10-CM | POA: Diagnosis not present

## 2018-02-13 DIAGNOSIS — D497 Neoplasm of unspecified behavior of endocrine glands and other parts of nervous system: Secondary | ICD-10-CM | POA: Diagnosis not present

## 2018-02-13 DIAGNOSIS — E039 Hypothyroidism, unspecified: Secondary | ICD-10-CM | POA: Diagnosis not present

## 2018-02-27 DIAGNOSIS — F329 Major depressive disorder, single episode, unspecified: Secondary | ICD-10-CM | POA: Diagnosis not present

## 2018-02-27 DIAGNOSIS — Z299 Encounter for prophylactic measures, unspecified: Secondary | ICD-10-CM | POA: Diagnosis not present

## 2018-02-27 DIAGNOSIS — Z0000 Encounter for general adult medical examination without abnormal findings: Secondary | ICD-10-CM | POA: Diagnosis not present

## 2018-02-27 DIAGNOSIS — E039 Hypothyroidism, unspecified: Secondary | ICD-10-CM | POA: Diagnosis not present

## 2018-02-27 DIAGNOSIS — I10 Essential (primary) hypertension: Secondary | ICD-10-CM | POA: Diagnosis not present

## 2018-02-27 DIAGNOSIS — Z6841 Body Mass Index (BMI) 40.0 and over, adult: Secondary | ICD-10-CM | POA: Diagnosis not present

## 2018-02-27 DIAGNOSIS — Z79899 Other long term (current) drug therapy: Secondary | ICD-10-CM | POA: Diagnosis not present

## 2018-02-27 DIAGNOSIS — Z1331 Encounter for screening for depression: Secondary | ICD-10-CM | POA: Diagnosis not present

## 2018-07-14 DIAGNOSIS — Z299 Encounter for prophylactic measures, unspecified: Secondary | ICD-10-CM | POA: Diagnosis not present

## 2018-07-14 DIAGNOSIS — Z6841 Body Mass Index (BMI) 40.0 and over, adult: Secondary | ICD-10-CM | POA: Diagnosis not present

## 2018-07-14 DIAGNOSIS — I10 Essential (primary) hypertension: Secondary | ICD-10-CM | POA: Diagnosis not present

## 2018-07-14 DIAGNOSIS — F329 Major depressive disorder, single episode, unspecified: Secondary | ICD-10-CM | POA: Diagnosis not present

## 2018-08-10 DIAGNOSIS — Z299 Encounter for prophylactic measures, unspecified: Secondary | ICD-10-CM | POA: Diagnosis not present

## 2018-08-10 DIAGNOSIS — I10 Essential (primary) hypertension: Secondary | ICD-10-CM | POA: Diagnosis not present

## 2018-08-10 DIAGNOSIS — Z713 Dietary counseling and surveillance: Secondary | ICD-10-CM | POA: Diagnosis not present

## 2018-08-10 DIAGNOSIS — F329 Major depressive disorder, single episode, unspecified: Secondary | ICD-10-CM | POA: Diagnosis not present

## 2018-08-10 DIAGNOSIS — Z6841 Body Mass Index (BMI) 40.0 and over, adult: Secondary | ICD-10-CM | POA: Diagnosis not present

## 2018-08-13 ENCOUNTER — Encounter: Payer: Self-pay | Admitting: Family Medicine

## 2018-08-13 ENCOUNTER — Ambulatory Visit (INDEPENDENT_AMBULATORY_CARE_PROVIDER_SITE_OTHER): Payer: Self-pay | Admitting: Family Medicine

## 2018-08-13 VITALS — BP 112/78 | HR 87 | Temp 98.3°F | Wt 304.0 lb

## 2018-08-13 DIAGNOSIS — T7840XA Allergy, unspecified, initial encounter: Secondary | ICD-10-CM

## 2018-08-13 DIAGNOSIS — R21 Rash and other nonspecific skin eruption: Secondary | ICD-10-CM

## 2018-08-13 MED ORDER — PREDNISONE 20 MG PO TABS: ORAL_TABLET | ORAL | 0 refills | Status: DC

## 2018-08-13 MED ORDER — METHYLPREDNISOLONE SODIUM SUCC 125 MG IJ SOLR
125.0000 mg | Freq: Once | INTRAMUSCULAR | Status: AC
  Administered 2018-08-13: 125 mg via INTRAMUSCULAR

## 2018-08-13 MED ORDER — TRIAMCINOLONE ACETONIDE 0.025 % EX OINT: 1.0000 "application " | TOPICAL_OINTMENT | Freq: Two times a day (BID) | CUTANEOUS | 0 refills | Status: DC

## 2018-08-13 MED ORDER — TRIAMCINOLONE ACETONIDE 0.5 % EX OINT: 1.0000 "application " | TOPICAL_OINTMENT | Freq: Three times a day (TID) | CUTANEOUS | 0 refills | Status: DC

## 2018-08-13 NOTE — Patient Instructions (Addendum)
I would like for you to contact your PCP and discuss discontinuing Lisinopril. You received a solumedrol injection today. Therefore start your prednisone tomorrow and take as follows:  Take Prednisone 20 mg,  in mornings with breakfast as follows:  Take 3 pills for 3 days, Take 2 pills for 3 days, and Take 1 pill for 3 days.  Complete all medication.  For itching, apply triamcinolone ointment 3 times daily as needed to affected areas.     Anaphylactic Reaction An anaphylactic reaction (anaphylaxis) is a sudden allergic reaction that is very bad (severe). It also affects more than one part of the body. This condition can be life-threatening. If you have an anaphylactic reaction, you need to get medical help right away. You may need to stay in the hospital. Your doctor may teach you how to use an allergy kit (anaphylaxis kit) and how to give yourself an allergy shot (epinephrine injection). You can give yourself an allergy shot with what is commonly called an auto-injector "pen." Symptoms of an anaphylactic reaction may include:  A stuffy nose (nasal congestion).  Headache.  Tingling in your mouth.  A flushed face.  An itchy, red rash.  Swelling of your eyes, lips, face, or tongue.  Swelling of the back of your mouth and your throat.  Breathing loudly (wheezing).  A hoarse voice.  Itchy, red, swollen areas of skin (hives).  Dizziness or light-headedness.  Passing out (fainting).  Feeling worried or nervous (anxiety).  Feeling confused.  Pain in your belly (abdomen) or chest.  Trouble with breathing, talking, or swallowing.  A tight feeling in your chest or throat.  Fast or uneven heartbeats (palpitations).  Throwing up (vomiting).  Watery poop (diarrhea).  Follow these instructions at home: Safety  Always keep an auto-injector pen or your allergy kit with you. These could save your life. Use them as told by your doctor.  Do not drive until your doctor says  that it is safe.  Make sure that you, the people who live with you, and your employer know: ? How to use your allergy kit. ? How to use an auto-injector pen to give you an allergy shot.  If you used your auto-injector pen: ? Get more medicine for it right away. This is important in case you have another reaction. ? Get help right away.  Wear a bracelet or necklace that says you have an allergy, if your doctor tells you to do this.  Learn the signs of a very bad allergic reaction.  Work with your doctors to make a plan for what to do if you have a very bad allergic reaction. Being prepared is important. General instructions  Take over-the-counter and prescription medicines only as told by your doctor.  If you have itchy, red, swollen areas of skin or a rash: ? Use over-the-counter medicine (antihistamine) as told by your doctor. ? Put cold, wet cloths (cold compresses) on your skin. ? Take baths or showers in cool water. Avoid hot water.  If you had tests done, it is up to you to get your test results. Ask your doctor when your results will be ready.  Tell any doctors who care for you that you have an allergy.  Keep all follow-up visits as told by your doctor. This is important. How is this prevented?  Avoid things (allergens) that gave you a very bad allergic reaction before.  If you have a food allergy and you go to a restaurant, tell your server about your  allergy. If you are not sure if your meal was made with food that you are allergic to, ask your server before you eat it. Contact a doctor if:  You have symptoms of an allergic reaction. You may notice them soon after being around whatever it is that you are allergic to. Symptoms may include: ? A rash. ? A headache. ? Sneezing or a runny nose. ? Swelling. ? Feeling sick to your stomach. ? Watery poop. Get help right away if:  You had to use your auto-injector pen. You must go to the emergency room even if the medicine  seems to be working.  You have any of these: ? A tight feeling in your chest or your throat. ? Loud breathing. ? Trouble with breathing. ? Itchy, red, swollen areas of skin. ? Red skin or itching all over your body. ? Swelling in your lips, tongue, or the back of your throat.  You have throwing up that gets very bad.  You have watery poop that gets very bad.  You pass out or feel like you might pass out. These symptoms may be an emergency. Do not wait to see if the symptoms will go away. Use your auto-injector pen or allergy kit as you have been told. Get medical help right away. Call your local emergency services (911 in the [place] Do not drive yourself to the hospital. Summary  An anaphylactic reaction (anaphylaxis) is a sudden allergic reaction that is very bad (severe).  This condition can be life-threatening. If you have an anaphylactic reaction, you need to get medical help right away.  Your doctor may teach you how to use an allergy kit (anaphylaxis kit) and how to give yourself an allergy shot (epinephrine injection) with an auto-injector "pen."  Always keep an auto-injector pen or your allergy kit with you. These could save your life. Use them as told by your doctor.  If you had to use your auto-injector pen, you must go to the emergency room even if the medicine seems to be working. This information is not intended to replace advice given to you by your health care provider. Make sure you discuss any questions you have with your health care provider. Document Released: 05/20/2008 Document Revised: 07/26/2016 Document Reviewed: 07/26/2016 Elsevier Interactive Patient Education  2017 [employer].

## 2018-08-13 NOTE — Progress Notes (Signed)
Patient ID: Sabrina [name], female    DOB: [birth year], 37 y.o.   MRN: [id number]  PCP: Monico Blitz, MD  Chief Complaint  Patient presents with  . choice-facial rash since yesterday    started yesterday evening itchy,dry and also stated throat is sore    Subjective:  HPI  Sabrina [name] is a 37 y.o. female presents for evaluation allergic reaction and facial rash.   Allergic Reaction/Rash:  Patient complains of rash initially involving her lips which has now extended to bilateral cheeks and forehead with some itching around the eyes. Complains of generalized itchiness.  Rash onset: within the last 24 hours  No recent new exposures to cosmetics or foods. She takes lisinopril for hypertension management and denies any prior reactions to medication. She endorses a chronic mild cough is present since resuming lisinopril earlier in the year. Last dose of lisinopril was today.Denies any sensation of throat swelling, however endorses mild soreness of throat with swallowing, denies difficulty breathing,denies  lesion or itching in mouth or throat. Social History   Socioeconomic History  . Marital status: Married    Spouse name: Not on file  . Number of children: Not on file  . Years of education: Not on file  . Highest education level: Not on file  Occupational History  . Not on file  Social Needs  . Financial resource strain: Not on file  . Food insecurity:    Worry: Not on file    Inability: Not on file  . Transportation needs:    Medical: Not on file    Non-medical: Not on file  Tobacco Use  . Smoking status: Never Smoker  . Smokeless tobacco: Never Used  Substance and Sexual Activity  . Alcohol use: Yes    Alcohol/week: 1.0 standard drinks    Types: 1 Glasses of wine per week    Comment: occasional glass of wine  . Drug use: No  . Sexual activity: Not on file  Lifestyle  . Physical activity:    Days per week: Not on file    Minutes per session: Not on file  . Stress:  Not on file  Relationships  . Social connections:    Talks on phone: Not on file    Gets together: Not on file    Attends religious service: Not on file    Active member of club or organization: Not on file    Attends meetings of clubs or organizations: Not on file    Relationship status: Not on file  . Intimate partner violence:    Fear of current or ex partner: Not on file    Emotionally abused: Not on file    Physically abused: Not on file    Forced sexual activity: Not on file  Other Topics Concern  . Not on file  Social History Narrative  . Not on file   Review of Systems Pertinent negatives listed in HPI Patient Active Problem List   Diagnosis Date Noted  . Papillary thyroid carcinoma ([place]) 11/24/2013    Allergies  Allergen Reactions  . Sulfa Antibiotics Nausea And Vomiting  . Penicillins Rash    Prior to Admission medications   Medication Sig Start Date End Date Taking? Authorizing Provider  buPROPion [facility] SR) 150 MG 12 hr tablet  08/10/18  Yes [provider]  Cholecalciferol (VITAMIN D PO) Take 2 tablets by mouth daily.   Yes [provider]  citalopram (CELEXA) 20 MG tablet Take 20 mg by mouth daily.  Yes [provider]  clonazePAM (KLONOPIN) 0.5 MG tablet Take 0.25 mg by mouth 2 (two) times daily as needed for anxiety.   Yes [provider]  levothyroxine (SYNTHROID, LEVOTHROID) 137 MCG tablet Take 137 mcg by mouth daily before breakfast.   Yes [provider]  lisinopril (PRINIVIL,ZESTRIL) 5 MG tablet  07/14/18  Yes [provider]  metoprolol succinate (TOPROL-XL) 25 MG 24 hr tablet  06/09/18  Yes [provider]  vitamin B-12 (CYANOCOBALAMIN) 1000 MCG tablet Take 1,000 mcg by mouth daily.   Yes [provider]  diclofenac (VOLTAREN) 75 MG EC tablet Take 75 mg by mouth 2 (two) times daily as needed for mild pain.    [provider]  HYDROcodone-acetaminophen (NORCO) 7.5-325  MG per tablet Take 1 tablet by mouth every 6 (six) hours as needed for moderate pain. Patient not taking: Reported on 07/08/2017 11/25/13   Izora Gala, MD  levothyroxine (SYNTHROID, LEVOTHROID) 112 MCG tablet Take 224 mcg by mouth See admin instructions. *mondays through Friday*    [provider]  levothyroxine (SYNTHROID, LEVOTHROID) 125 MCG tablet Take 250 mcg by mouth See admin instructions. Saturday and sunday    [provider]  promethazine (PHENERGAN) 25 MG suppository Place 1 suppository (25 mg total) rectally every 6 (six) hours as needed for nausea or vomiting. Patient not taking: Reported on 07/08/2017 11/25/13   Izora Gala, MD  tobramycin (TOBREX) 0.3 % ophthalmic solution Place 2 drops into the left eye every 4 (four) hours. Patient not taking: Reported on 08/13/2018 07/08/17   Versie Starks, PA-C    Past Medical, Surgical Family and Social History reviewed and updated.    Objective:   Today's Vitals   08/13/18 0931  BP: 112/78  Pulse: 87  Temp: 98.3 F (36.8 C)  SpO2: 98%  Weight: (!) 304 lb (137.9 kg)    Wt Readings from Last 3 Encounters:  08/13/18 (!) 304 lb (137.9 kg)  11/24/13 (!) 321 lb 14 oz (146 kg)  11/18/13 (!) 322 lb 4 oz (146.2 kg)    Physical Exam  HENT:  Head:     Patient is non-distressed and non-ill-appearing. Rash: maculopapular rash with some scaling present around the lesion present below lower lip. Airway open and clear. Breath sound normal.Heart rate and rhythm normal.   Assessment & Plan:  1. Allergic reaction, initial encounter 2. Facial rash Patient presents today with pruritic maculopapular lesions which are consistent   With a likely hypersensitive to reaction to an unknown irritant. Questionable if reaction is occurring in response to lisinopril. Patient reports a chronic cough since resuming medication earlier this year.  Recommended that she follow-up with PCP today via phone as I recommend she doesn't take  the lisinopril until she discussed other options with PCP as [facility] doesn't start or change medications as this is an acute care clinic.    - MethylPREDNISolone sodium succinate (SOLU-MEDROL) 125 mg/2 mL injection order to be given now IM and allow patient to remain in office for 15 minutes. -Tomorrow start prednisone taper -Apply triamcinolone ointment, up to 3 times daily,  to areas of irritation with care to avoid eyes.  -Benadryl or any antihistamine OTC as directed for relief of itching. Meds ordered this encounter  Medications  . methylPREDNISolone sodium succinate (SOLU-MEDROL) 125 mg/2 mL injection 125 mg  . predniSONE (DELTASONE) 20 MG tablet    Sig: Take Prednisone 20 mg, in mornings with breakfast as follows:Take 3 pills for 3 days, Take  2 pills for 3 days, and Take 1 pill for 3 days. Complete all medication.    Dispense:  18 tablet    Refill:  0  . DISCONTD: triamcinolone (KENALOG) 0.025 % ointment    Sig: Apply 1 application topically 2 (two) times daily.    Dispense:  30 g    Refill:  0  . DISCONTD: triamcinolone (KENALOG) 0.025 % ointment    Sig: Apply 1 application topically 2 (two) times daily.    Dispense:  60 g    Refill:  0  . triamcinolone ointment (KENALOG) 0.5 %    Sig: Apply 1 application topically 3 (three) times daily.    Dispense:  60 g    Refill:  0     If symptoms worsen or do not improve, return for follow-up, follow-up with PCP, or at the emergency department if severity of symptoms warrant a higher level of care.    Carroll Sage. Kenton Kingfisher, MSN, FNP-C [facility]  [place], [place] [address] [phone]

## 2018-08-18 ENCOUNTER — Telehealth: Payer: Self-pay | Admitting: Emergency Medicine

## 2018-08-18 NOTE — Telephone Encounter (Signed)
Follow up call from visit with [facility] provider. Left message

## 2018-08-26 DIAGNOSIS — F329 Major depressive disorder, single episode, unspecified: Secondary | ICD-10-CM | POA: Diagnosis not present

## 2018-08-26 DIAGNOSIS — Z713 Dietary counseling and surveillance: Secondary | ICD-10-CM | POA: Diagnosis not present

## 2018-08-26 DIAGNOSIS — Z6841 Body Mass Index (BMI) 40.0 and over, adult: Secondary | ICD-10-CM | POA: Diagnosis not present

## 2018-08-26 DIAGNOSIS — Z299 Encounter for prophylactic measures, unspecified: Secondary | ICD-10-CM | POA: Diagnosis not present

## 2018-08-26 DIAGNOSIS — I10 Essential (primary) hypertension: Secondary | ICD-10-CM | POA: Diagnosis not present

## 2019-04-08 MED FILL — CITALOPRAM HBR 40 MG TABLET: 40 | 90 days supply | Qty: 90 | Fill #0

## 2019-07-01 MED FILL — BUPROPION HCL SR 150 MG TAB: 150 | 90 days supply | Qty: 180 | Fill #0

## 2019-07-16 DIAGNOSIS — Z1331 Encounter for screening for depression: Secondary | ICD-10-CM | POA: Diagnosis not present

## 2019-07-16 DIAGNOSIS — Z79899 Other long term (current) drug therapy: Secondary | ICD-10-CM | POA: Diagnosis not present

## 2019-07-16 DIAGNOSIS — Z6841 Body Mass Index (BMI) 40.0 and over, adult: Secondary | ICD-10-CM | POA: Diagnosis not present

## 2019-07-16 DIAGNOSIS — I10 Essential (primary) hypertension: Secondary | ICD-10-CM | POA: Diagnosis not present

## 2019-07-16 DIAGNOSIS — E7800 Pure hypercholesterolemia, unspecified: Secondary | ICD-10-CM | POA: Diagnosis not present

## 2019-07-16 DIAGNOSIS — E039 Hypothyroidism, unspecified: Secondary | ICD-10-CM | POA: Diagnosis not present

## 2019-07-16 DIAGNOSIS — Z0000 Encounter for general adult medical examination without abnormal findings: Secondary | ICD-10-CM | POA: Diagnosis not present

## 2019-07-16 DIAGNOSIS — Z299 Encounter for prophylactic measures, unspecified: Secondary | ICD-10-CM | POA: Diagnosis not present

## 2019-07-16 DIAGNOSIS — Z789 Other specified health status: Secondary | ICD-10-CM | POA: Diagnosis not present

## 2019-08-02 DIAGNOSIS — I10 Essential (primary) hypertension: Secondary | ICD-10-CM | POA: Diagnosis not present

## 2019-08-02 DIAGNOSIS — E890 Postprocedural hypothyroidism: Secondary | ICD-10-CM | POA: Diagnosis not present

## 2019-08-02 DIAGNOSIS — C73 Malignant neoplasm of thyroid gland: Secondary | ICD-10-CM | POA: Diagnosis not present

## 2020-02-22 MED FILL — BUPROPION HCL SR 150 MG TAB: 150 | 90 days supply | Qty: 180 | Fill #0

## 2020-04-06 DIAGNOSIS — Z299 Encounter for prophylactic measures, unspecified: Secondary | ICD-10-CM | POA: Diagnosis not present

## 2020-04-06 DIAGNOSIS — E039 Hypothyroidism, unspecified: Secondary | ICD-10-CM | POA: Diagnosis not present

## 2020-04-06 DIAGNOSIS — Z6841 Body Mass Index (BMI) 40.0 and over, adult: Secondary | ICD-10-CM | POA: Diagnosis not present

## 2020-04-06 DIAGNOSIS — I10 Essential (primary) hypertension: Secondary | ICD-10-CM | POA: Diagnosis not present

## 2020-04-11 ENCOUNTER — Other Ambulatory Visit: Payer: Self-pay | Admitting: Surgery

## 2020-04-11 ENCOUNTER — Other Ambulatory Visit (HOSPITAL_COMMUNITY): Payer: Self-pay | Admitting: Surgery

## 2020-04-27 ENCOUNTER — Encounter: Payer: 59 | Attending: Surgery | Admitting: Dietician

## 2020-04-27 ENCOUNTER — Other Ambulatory Visit: Payer: Self-pay

## 2020-04-27 ENCOUNTER — Encounter: Payer: Self-pay | Admitting: Dietician

## 2020-04-27 DIAGNOSIS — E669 Obesity, unspecified: Secondary | ICD-10-CM | POA: Diagnosis not present

## 2020-04-27 NOTE — Progress Notes (Signed)
Nutrition Assessment for Bariatric Surgery Medical Nutrition Therapy   Patient was seen on 04/27/2020 for Pre-Operative Nutrition Assessment. Letter of approval faxed to [facility] bariatric surgery program coordinator on 04/27/2020.   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 Lives with her husband and kids, shares cooking/food responsibilities with her husband. Eats out a few times per week. Typical meal pattern is 3 meals + 2 snacks per day. May have chicken with cauliflower rice and broccoli. Currently trying to follow keto diet. Has cut out sodas for ~3 weeks and states she has experienced weight loss.   24-Hr Dietary Recall First Meal: egg omelet (2 eggs, cheese, peppers, onions)  Snack: string cheese Second Meal: beef + brussels sprouts  Snack: peppers + cream cheese  Third Meal: [employer] chicken nuggets + fries  Snack: -  Beverages: water, unsweet tea w/ Stevia, coffee w/ sugar-free creamer   Estimated Energy Needs Calories: 1600-1800 Carbohydrate: 180-200g Protein: 100-113g Fat: 53-60g   NUTRITION DIAGNOSIS  Overweight/obesity ([place]-3.3) related to past poor dietary habits and physical inactivity as evidenced by patient w/ planned Sleeve Gastrectomy surgery following dietary guidelines for continued weight loss.    NUTRITION INTERVENTION  Nutrition counseling (C-1) and education (E-2) to facilitate bariatric surgery goals.  Pre-Op Goals Reviewed with the Patient . Track food and beverage intake (pen and paper, MyFitness Pal, Baritastic app, etc.) . Make healthy food choices while monitoring portion sizes . Consume 3 meals per day or try  to eat every 3-5 hours . Avoid concentrated sugars and fried foods . Keep sugar & fat in the single digits per serving on food labels . Practice CHEWING your food (aim for applesauce consistency) . Practice not drinking 15 minutes before, during, and 30 minutes after each meal and snack . Avoid all carbonated beverages (ex: soda, sparkling beverages)  . Limit caffeinated beverages (ex: coffee, tea, energy drinks) . Avoid all sugar-sweetened beverages (ex: regular soda, sports drinks)  . Avoid alcohol  . Aim for 64-100 ounces of FLUID daily (with at least half of fluid intake being plain water)  . Aim for at least 60-80 grams of PROTEIN daily . Look for a liquid protein source that contains ?15 g protein and ?5 g carbohydrate (ex: shakes, drinks, shots) . Make a list of non-food related activities . Physical activity is an important part of a healthy lifestyle so keep it moving! The goal is to reach 150 minutes of exercise per week, including cardiovascular and weight baring activity.  Handouts Provided Include  . Bariatric Surgery handouts (Nutrition Visits, Pre-Op Goals, Protein Shakes, Vitamins & Minerals)  Learning Style & Readiness for Change Teaching method utilized: Visual & Auditory  Demonstrated degree of understanding via: Teach Back  Barriers to learning/adherence to lifestyle change: None Identified   MONITORING & EVALUATION Dietary intake, weekly physical activity, body weight, and pre-op goals reached at next nutrition visit.   Next Steps Patient is to follow up at [facility] for Pre-Op Class (>2 weeks before surgery) for further nutrition education.

## 2020-05-04 ENCOUNTER — Other Ambulatory Visit: Payer: Self-pay

## 2020-05-04 ENCOUNTER — Ambulatory Visit (HOSPITAL_COMMUNITY)
Admission: RE | Admit: 2020-05-04 | Discharge: 2020-05-04 | Disposition: A | Discharge status: Home / Self Care | Payer: 59 | Source: Ambulatory Visit | Attending: Surgery | Admitting: Surgery

## 2020-05-04 DIAGNOSIS — K449 Diaphragmatic hernia without obstruction or gangrene: Secondary | ICD-10-CM | POA: Diagnosis not present

## 2020-06-09 ENCOUNTER — Ambulatory Visit (INDEPENDENT_AMBULATORY_CARE_PROVIDER_SITE_OTHER): Payer: 59 | Admitting: Psychology

## 2020-07-07 ENCOUNTER — Ambulatory Visit (INDEPENDENT_AMBULATORY_CARE_PROVIDER_SITE_OTHER): Payer: 59 | Admitting: Psychology

## 2020-08-02 ENCOUNTER — Other Ambulatory Visit
Admission: RE | Admit: 2020-08-02 | Discharge: 2020-08-02 | Disposition: A | Discharge status: Home / Self Care | Payer: 59 | Source: Ambulatory Visit | Attending: Surgery | Admitting: Surgery

## 2020-08-02 DIAGNOSIS — Z01812 Encounter for preprocedural laboratory examination: Secondary | ICD-10-CM | POA: Insufficient documentation

## 2020-08-02 DIAGNOSIS — Z713 Dietary counseling and surveillance: Secondary | ICD-10-CM | POA: Insufficient documentation

## 2020-08-02 LAB — CBC WITH DIFFERENTIAL/PLATELET
Abs Immature Granulocytes: 0.02 10*3/uL (ref 0.00–0.07)
Basophils Absolute: 0 10*3/uL (ref 0.0–0.1)
Basophils Relative: 0 %
Eosinophils Absolute: 0.3 10*3/uL (ref 0.0–0.5)
Eosinophils Relative: 3 %
HCT: 36.4 % (ref 36.0–46.0)
Hemoglobin: 12.2 g/dL (ref 12.0–15.0)
Immature Granulocytes: 0 %
Lymphocytes Relative: 33 %
Lymphs Abs: 3 10*3/uL (ref 0.7–4.0)
MCH: 29.9 pg (ref 26.0–34.0)
MCHC: 33.5 g/dL (ref 30.0–36.0)
MCV: 89.2 fL (ref 80.0–100.0)
Monocytes Absolute: 0.7 10*3/uL (ref 0.1–1.0)
Monocytes Relative: 8 %
Neutro Abs: 5 10*3/uL (ref 1.7–7.7)
Neutrophils Relative %: 56 %
Platelets: 360 10*3/uL (ref 150–400)
RBC: 4.08 MIL/uL (ref 3.87–5.11)
RDW: 13.6 % (ref 11.5–15.5)
WBC: 9 10*3/uL (ref 4.0–10.5)
nRBC: 0 % (ref 0.0–0.2)

## 2020-08-02 LAB — TSH: TSH: 1.221 u[IU]/mL (ref 0.350–4.500)

## 2020-08-02 LAB — COMPREHENSIVE METABOLIC PANEL
ALT: 22 U/L (ref 0–44)
AST: 22 U/L (ref 15–41)
Albumin: 4.3 g/dL (ref 3.5–5.0)
Alkaline Phosphatase: 65 U/L (ref 38–126)
Anion gap: 12 (ref 5–15)
BUN: 15 mg/dL (ref 6–20)
CO2: 23 mmol/L (ref 22–32)
Calcium: 9.1 mg/dL (ref 8.9–10.3)
Chloride: 101 mmol/L (ref 98–111)
Creatinine, Ser: 0.64 mg/dL (ref 0.44–1.00)
GFR calc Af Amer: >60 mL/min (ref >60)
GFR calc non Af Amer: >60 mL/min (ref >60)
Glucose, Bld: 116 mg/dL — ABNORMAL HIGH (ref 70–99)
Potassium: 3.5 mmol/L (ref 3.5–5.1)
Sodium: 136 mmol/L (ref 135–145)
Total Bilirubin: 0.8 mg/dL (ref 0.3–1.2)
Total Protein: 7.6 g/dL (ref 6.5–8.1)

## 2020-08-02 LAB — VITAMIN B12: Vitamin B-12: 541 pg/mL (ref 180–914)

## 2020-08-03 LAB — HEMOGLOBIN A1C
Hgb A1c MFr Bld: 5.7 % — ABNORMAL HIGH (ref 4.8–5.6)
Mean Plasma Glucose: 116.89 mg/dL

## 2020-08-09 ENCOUNTER — Other Ambulatory Visit: Payer: Self-pay | Admitting: Internal Medicine

## 2020-08-16 DIAGNOSIS — I10 Essential (primary) hypertension: Secondary | ICD-10-CM | POA: Diagnosis not present

## 2020-08-16 DIAGNOSIS — Z0000 Encounter for general adult medical examination without abnormal findings: Secondary | ICD-10-CM | POA: Diagnosis not present

## 2020-08-16 DIAGNOSIS — Z79899 Other long term (current) drug therapy: Secondary | ICD-10-CM | POA: Diagnosis not present

## 2020-08-16 DIAGNOSIS — Z299 Encounter for prophylactic measures, unspecified: Secondary | ICD-10-CM | POA: Diagnosis not present

## 2020-08-16 DIAGNOSIS — E039 Hypothyroidism, unspecified: Secondary | ICD-10-CM | POA: Diagnosis not present

## 2020-08-16 DIAGNOSIS — E7800 Pure hypercholesterolemia, unspecified: Secondary | ICD-10-CM | POA: Diagnosis not present

## 2020-08-16 DIAGNOSIS — Z1331 Encounter for screening for depression: Secondary | ICD-10-CM | POA: Diagnosis not present

## 2020-08-28 ENCOUNTER — Other Ambulatory Visit: Payer: Self-pay | Admitting: Nurse Practitioner

## 2020-08-28 DIAGNOSIS — Z1231 Encounter for screening mammogram for malignant neoplasm of breast: Secondary | ICD-10-CM

## 2020-09-08 ENCOUNTER — Inpatient Hospital Stay: Admission: RE | Admit: 2020-09-08 | Payer: 59 | Source: Ambulatory Visit

## 2020-10-02 ENCOUNTER — Other Ambulatory Visit: Payer: Self-pay | Admitting: Student

## 2020-10-23 ENCOUNTER — Other Ambulatory Visit: Payer: Self-pay | Admitting: Family Medicine

## 2020-11-16 ENCOUNTER — Ambulatory Visit: Payer: 59 | Admitting: Psychology

## 2020-11-27 ENCOUNTER — Other Ambulatory Visit: Payer: Self-pay

## 2020-11-27 ENCOUNTER — Encounter: Payer: 59 | Attending: Surgery | Admitting: Skilled Nursing Facility1

## 2020-11-27 DIAGNOSIS — E669 Obesity, unspecified: Secondary | ICD-10-CM | POA: Diagnosis not present

## 2020-11-27 NOTE — Progress Notes (Signed)
Pre-Operative Nutrition Class:  Appt start time: 9735   End time:  1830.  Patient was seen on 11/27/2020 for Pre-Operative Bariatric Surgery [facility]    Surgery date: 12/12/2020 Surgery type: sleeve Start weight at [facility]: 331 Weight today: 299  Samples given per MNT protocol. Patient educated on appropriate usage: Procare Multivitamin Lot [phone] Exp:12/2021  Celebrate Calcium  Lot # [id number] Exp:03/2022  Protein02 drink Shake Lot #[id number] Exp: 10/23  The following the learning objectives were met by the patient during this course:  Identify Pre-Op Dietary Goals and will begin 2 weeks pre-operatively  Identify appropriate sources of fluids and proteins   State protein recommendations and appropriate sources pre and post-operatively  Identify Post-Operative Dietary Goals and will follow for 2 weeks post-operatively  Identify appropriate multivitamin and calcium sources  Describe the need for physical activity post-operatively and will follow MD recommendations  State when to call healthcare provider regarding medication questions or post-operative complications  Handouts given during class include:  Pre-Op Bariatric Surgery Diet Handout  Protein Shake Handout  Post-Op Bariatric Surgery Nutrition Handout  [facility] Program Information Flyer  Support Group Information Flyer  [facility] Bariatric Supplements Price List  Follow-Up Plan: Patient will follow-up at [facility] 2 weeks post operatively for diet advancement per MD.

## 2020-11-30 NOTE — Patient Instructions (Addendum)
DUE TO COVID-19 ONLY ONE VISITOR IS ALLOWED TO COME WITH YOU AND STAY IN THE WAITING ROOM ONLY DURING PRE OP AND PROCEDURE DAY OF SURGERY. THE 1 VISITOR  MAY VISIT WITH YOU AFTER SURGERY IN YOUR PRIVATE ROOM DURING VISITING HOURS ONLY!  YOU NEED TO HAVE A COVID 19 TEST ON: 12/11/20 @ 8:30 AM, THIS TEST MUST BE DONE BEFORE SURGERY,  COVID TESTING SITE [place] [address], IT IS ON THE RIGHT GOING OUT [address] APPROXIMATELY  2 MINUTES PAST [employer] ON THE RIGHT. ONCE YOUR COVID TEST IS COMPLETED,  PLEASE BEGIN THE QUARANTINE INSTRUCTIONS AS OUTLINED IN YOUR HANDOUT.                Sabrina [name]    Your procedure is scheduled on: 12/12/20   Report to [facility] Main  Entrance   Report to admitting at: 1:00 PM     Call this number if you have problems the morning of surgery [phone]    Remember:   MORNING OF SURGERY DRINK:   DRINK 1 G2 drink BEFORE YOU LEAVE HOME, DRINK ALL OF THE  G2 DRINK AT ONE TIME.   NO SOLID FOOD AFTER 600 PM THE NIGHT BEFORE YOUR SURGERY. YOU MAY DRINK CLEAR FLUIDS( UNTIL; 12:00 PM). THE G2 DRINK YOU DRINK BEFORE YOU LEAVE HOME WILL BE THE LAST FLUIDS YOU DRINK BEFORE SURGERY.  PAIN IS EXPECTED AFTER SURGERY AND WILL NOT BE COMPLETELY ELIMINATED. AMBULATION AND TYLENOL WILL HELP REDUCE INCISIONAL AND GAS PAIN. MOVEMENT IS KEY!  YOU ARE EXPECTED TO BE OUT OF BED WITHIN 4 HOURS OF ADMISSION TO YOUR PATIENT ROOM.  SITTING IN THE RECLINER THROUGHOUT THE DAY IS IMPORTANT FOR DRINKING FLUIDS AND MOVING GAS THROUGHOUT THE GI TRACT.  COMPRESSION STOCKINGS SHOULD BE WORN [place] UNLESS YOU ARE WALKING.   INCENTIVE SPIROMETER SHOULD BE USED EVERY HOUR WHILE AWAKE TO DECREASE POST-OPERATIVE COMPLICATIONS SUCH AS PNEUMONIA.  WHEN DISCHARGED HOME, IT IS IMPORTANT TO CONTINUE TO WALK EVERY HOUR AND USE THE INCENTIVE SPIROMETER EVERY HOUR.   CLEAR LIQUID DIET   Foods Allowed                                                                      Foods Excluded  Coffee and tea, regular and decaf                             liquids that you cannot  Plain Jell-O any favor except red or purple                                           see through such as: Fruit ices (not with fruit pulp)                                     milk, soups, orange juice  Iced Popsicles  All solid food Carbonated beverages, regular and diet                                    Cranberry, grape and apple juices Sports drinks like Gatorade Lightly seasoned clear broth or consume(fat free) Sugar, honey syrup  Sample Menu Breakfast                                Lunch                                     Supper Cranberry juice                    Beef broth                            Chicken broth Jell-O                                     Grape juice                           Apple juice Coffee or tea                        Jell-O                                      Popsicle                                                Coffee or tea                        Coffee or tea  _____________________________________________________________________  BRUSH YOUR TEETH MORNING OF SURGERY AND RINSE YOUR MOUTH OUT, NO CHEWING GUM CANDY OR MINTS.    Take these medicines the morning of surgery with A SIP OF WATER: Bupropion,citalopram,synthroid,metoprolol.Clonazepam as needed.                               You may not have any metal on your body including hair pins and              piercings  Do not wear jewelry, make-up, lotions, powders or perfumes, deodorant             Do not wear nail polish on your fingernails.  Do not shave  48 hours prior to surgery.               Do not bring valuables to the hospital. [place].  Contacts, dentures or bridgework may not be worn into surgery.  Leave suitcase in the car. After surgery it may be brought to  your room.  Patients discharged the day of surgery will not be allowed to drive home. IF YOU ARE HAVING SURGERY AND GOING HOME THE SAME DAY, YOU MUST HAVE AN ADULT TO DRIVE YOU HOME AND BE WITH YOU FOR 24 HOURS. YOU MAY GO HOME BY TAXI OR UBER OR ORTHERWISE, BUT AN ADULT MUST ACCOMPANY YOU HOME AND STAY WITH YOU FOR 24 HOURS.  Name and phone number of your driver:  Special Instructions: N/A              Please read over the following fact sheets you were given: _____________________________________________________________________          [facility] - Preparing for Surgery Before surgery, you can play an important role.  Because skin is not sterile, your skin needs to be as free of germs as possible.  You can reduce the number of germs on your skin by washing with CHG (chlorahexidine gluconate) soap before surgery.  CHG is an antiseptic cleaner which kills germs and bonds with the skin to continue killing germs even after washing. Please DO NOT use if you have an allergy to CHG or antibacterial soaps.  If your skin becomes reddened/irritated stop using the CHG and inform your nurse when you arrive at Short Stay. Do not shave (including legs and underarms) for at least 48 hours prior to the first CHG shower.  You may shave your face/neck. Please follow these instructions carefully:  1.  Shower with CHG Soap the night before surgery and the  morning of Surgery.  2.  If you choose to wash your hair, wash your hair first as usual with your  normal  shampoo.  3.  After you shampoo, rinse your hair and body thoroughly to remove the  shampoo.                           4.  Use CHG as you would any other liquid soap.  You can apply chg directly  to the skin and wash                       Gently with a scrungie or clean washcloth.  5.  Apply the CHG Soap to your body ONLY FROM THE NECK DOWN.   Do not use on face/ open                           Wound or open sores. Avoid contact with eyes, ears mouth  and genitals (private parts).                       Wash face,  Genitals (private parts) with your normal soap.             6.  Wash thoroughly, paying special attention to the area where your surgery  will be performed.  7.  Thoroughly rinse your body with warm water from the neck down.  8.  DO NOT shower/wash with your normal soap after using and rinsing off  the CHG Soap.                9.  Pat yourself dry with a clean towel.            10.  Wear clean pajamas.            11.  Place clean sheets on your bed the night of your first  shower and do not  sleep with pets. Day of Surgery : Do not apply any lotions/deodorants the morning of surgery.  Please wear clean clothes to the hospital/surgery center.  FAILURE TO FOLLOW THESE INSTRUCTIONS MAY RESULT IN THE CANCELLATION OF YOUR SURGERY PATIENT SIGNATURE_________________________________  NURSE SIGNATURE__________________________________  ________________________________________________________________________

## 2020-12-01 ENCOUNTER — Ambulatory Visit: Payer: Self-pay | Admitting: Surgery

## 2020-12-01 ENCOUNTER — Ambulatory Visit (INDEPENDENT_AMBULATORY_CARE_PROVIDER_SITE_OTHER): Payer: 59 | Admitting: Psychology

## 2020-12-01 ENCOUNTER — Encounter (HOSPITAL_COMMUNITY): Payer: Self-pay

## 2020-12-01 ENCOUNTER — Other Ambulatory Visit: Payer: Self-pay

## 2020-12-01 ENCOUNTER — Encounter (HOSPITAL_COMMUNITY)
Admission: RE | Admit: 2020-12-01 | Discharge: 2020-12-01 | Disposition: A | Discharge status: Home / Self Care | Payer: 59 | Source: Ambulatory Visit | Attending: Surgery | Admitting: Surgery

## 2020-12-01 DIAGNOSIS — Z01818 Encounter for other preprocedural examination: Secondary | ICD-10-CM | POA: Diagnosis not present

## 2020-12-01 HISTORY — DX: Depression, unspecified: F32.A

## 2020-12-01 LAB — CBC WITH DIFFERENTIAL/PLATELET
Abs Immature Granulocytes: 0.01 10*3/uL (ref 0.00–0.07)
Basophils Absolute: 0 10*3/uL (ref 0.0–0.1)
Basophils Relative: 0 %
Eosinophils Absolute: 0.1 10*3/uL (ref 0.0–0.5)
Eosinophils Relative: 1 %
HCT: 40.1 % (ref 36.0–46.0)
Hemoglobin: 13.2 g/dL (ref 12.0–15.0)
Immature Granulocytes: 0 %
Lymphocytes Relative: 26 %
Lymphs Abs: 2.1 10*3/uL (ref 0.7–4.0)
MCH: 30.3 pg (ref 26.0–34.0)
MCHC: 32.9 g/dL (ref 30.0–36.0)
MCV: 92 fL (ref 80.0–100.0)
Monocytes Absolute: 0.7 10*3/uL (ref 0.1–1.0)
Monocytes Relative: 9 %
Neutro Abs: 5.1 10*3/uL (ref 1.7–7.7)
Neutrophils Relative %: 64 %
Platelets: 349 10*3/uL (ref 150–400)
RBC: 4.36 MIL/uL (ref 3.87–5.11)
RDW: 12.9 % (ref 11.5–15.5)
WBC: 8.1 10*3/uL (ref 4.0–10.5)
nRBC: 0 % (ref 0.0–0.2)

## 2020-12-01 LAB — COMPREHENSIVE METABOLIC PANEL
ALT: 31 U/L (ref 0–44)
AST: 23 U/L (ref 15–41)
Albumin: 4.1 g/dL (ref 3.5–5.0)
Alkaline Phosphatase: 62 U/L (ref 38–126)
Anion gap: 12 (ref 5–15)
BUN: 21 mg/dL — ABNORMAL HIGH (ref 6–20)
CO2: 23 mmol/L (ref 22–32)
Calcium: 9.2 mg/dL (ref 8.9–10.3)
Chloride: 102 mmol/L (ref 98–111)
Creatinine, Ser: 0.66 mg/dL (ref 0.44–1.00)
GFR, Estimated: >60 mL/min (ref >60)
Glucose, Bld: 92 mg/dL (ref 70–99)
Potassium: 4 mmol/L (ref 3.5–5.1)
Sodium: 137 mmol/L (ref 135–145)
Total Bilirubin: 0.7 mg/dL (ref 0.3–1.2)
Total Protein: 7.3 g/dL (ref 6.5–8.1)

## 2020-12-01 LAB — TYPE AND SCREEN
ABO/RH(D): A POS
Antibody Screen: NEGATIVE

## 2020-12-01 NOTE — Progress Notes (Signed)
COVID Vaccine Completed: Yes Date COVID Vaccine completed: 09/2020 COVID vaccine manufacturer: Building [employer]  PCP - Dr. Monico Blitz Cardiologist - No  Chest x-ray -  EKG - 05/04/20 Stress Test -  ECHO -  Cardiac Cath -  Pacemaker/ICD device last checked:  Sleep Study -  CPAP -   Fasting Blood Sugar -  Checks Blood Sugar _____ times a day  Blood Thinner Instructions: Aspirin Instructions: Last Dose:  Anesthesia review: Hx: HTN,Heart murmur as a child.  Patient denies shortness of breath, fever, cough and chest pain at PAT appointment   Patient verbalized understanding of instructions that were given to them at the PAT appointment. Patient was also instructed that they will need to review over the PAT instructions again at home before surgery.

## 2020-12-06 DIAGNOSIS — I10 Essential (primary) hypertension: Secondary | ICD-10-CM | POA: Diagnosis not present

## 2020-12-06 NOTE — H&P (Addendum)
Chief Complaint:  Morbid obesity BMI of 49  History of Present Illness:  Sabrina [name] is an 39 y.o. female who was initially seen by Dr. Ovidio Kin .  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  She was given informed consent regarding sleeve gastrectomy both by him and by me..  She has lost about 50 pounds since that .  She denies any history of DVT.  She's had a prior laparoscopic chole.  She has a small hiatal hernia but no reflux or symptoms of GER  Past Medical History:  Diagnosis Date  . Anxiety   . Cancer (HCC)    thyroid  . Depression   . Heart murmur    as a baby  . Hypertension   . Pneumonia    as a child  . PONV (postoperative nausea and vomiting)     Past Surgical History:  Procedure Laterality Date  . CESAREAN SECTION  2010  . CHOLECYSTECTOMY  2005  . MASS EXCISION Right 11/24/2013   Procedure: EXCISION OF A RIGHT THYROID MASS;  Surgeon: Serena Colonel, MD;  Location: [facility] OR;  Service: ENT;  Laterality: Right;  . NECK DISSECTION    . THYROIDECTOMY      No current facility-administered medications for this encounter.   Current Outpatient Medications  Medication Sig Dispense Refill  . BIOTIN PO Take 1 tablet by mouth daily.    Marland Kitchen buPROPion (WELLBUTRIN SR) 150 MG 12 hr tablet Take 150 mg by mouth 2 (two) times daily.  2  . Cholecalciferol (VITAMIN D3 PO) Take 1 tablet by mouth daily.    . citalopram (CELEXA) 40 MG tablet Take 40 mg by mouth daily.    . clonazePAM (KLONOPIN) 0.5 MG tablet Take 0.25 mg by mouth 2 (two) times daily as needed for anxiety.    Marland Kitchen ibuprofen (ADVIL) 200 MG tablet Take 400-600 mg by mouth every 8 (eight) hours as needed (for pain.).    Marland Kitchen levothyroxine (SYNTHROID, LEVOTHROID) 137 MCG tablet Take 274 mcg by mouth daily before breakfast.    . metoprolol succinate (TOPROL-XL) 50 MG 24 hr tablet Take 50 mg by mouth daily.    . Multiple Vitamin (MULTIVITAMIN WITH MINERALS) TABS tablet Take 1 tablet by mouth daily.    .  vitamin B-12 (CYANOCOBALAMIN) 1000 MCG tablet Take 1,000 mcg by mouth daily.     Sulfa antibiotics and Penicillins No family history on file. Social History:   reports that she has never smoked. She has never used smokeless tobacco. She reports previous alcohol use of about 1.0 standard drink of alcohol per week. She reports that she does not use drugs.   REVIEW OF SYSTEMS : Negative except for see problem list  Physical Exam:   Last menstrual period 11/24/2020. There is no height or weight on file to calculate BMI.  Gen:  WDWN WF NAD  Neurological: Alert and oriented to person, place, and time. Motor and sensory function is grossly intact  Head: Normocephalic and atraumatic.  Eyes: Conjunctivae are normal. Pupils are equal, round, and reactive to light. No scleral icterus.  Neck: Normal range of motion. Neck supple. No tracheal deviation or thyromegaly present.  Cardiovascular:  SR without murmurs or gallops.  No carotid bruits Breast:  Not examined Respiratory: Effort normal.  No respiratory distress. No chest wall tenderness. Breath sounds normal.  No wheezes, rales or rhonchi.  Abdomen:  Obese, prior lap chole GU:  Not examined Musculoskeletal: Normal range  of motion. Extremities are nontender. No cyanosis, edema or clubbing noted Lymphadenopathy: No cervical, preauricular, postauricular or axillary adenopathy is present Skin: Skin is warm and dry. No rash noted. No diaphoresis. No erythema. No pallor. Pscyh: Normal mood and affect. Behavior is normal. Judgment and thought content normal.   LABORATORY RESULTS: Results for orders placed or performed during the hospital encounter of 12/12/20 (from the past 48 hour(s))  Pregnancy, urine STAT morning of surgery     Status: None   Collection Time: 12/12/20  1:07 PM  Result Value Ref Range   Preg Test, Ur NEGATIVE NEGATIVE    Comment:        THE SENSITIVITY OF THIS METHODOLOGY IS >20 mIU/mL. Performed at [facility], [place] [address] [place], [place] [address]   ABO/Rh     Status: None (Preliminary result)   Collection Time: 12/12/20  1:30 PM  Result Value Ref Range   ABO/RH(D) PENDING      RADIOLOGY RESULTS: No results found.  Problem List: Patient Active Problem List   Diagnosis Date Noted  . 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  Hx of thyroid cancer.  Prior lap chole and C section.  PE was repeated by me on 12/06/20    Matt B. Hassell Done, MD, [facility] [phone] beeper [phone]  12/06/2020 1:57 PM

## 2020-12-11 ENCOUNTER — Other Ambulatory Visit (HOSPITAL_COMMUNITY)
Admission: RE | Admit: 2020-12-11 | Discharge: 2020-12-11 | Disposition: A | Discharge status: Home / Self Care | Payer: 59 | Source: Ambulatory Visit | Attending: Surgery | Admitting: Surgery

## 2020-12-11 DIAGNOSIS — Z79899 Other long term (current) drug therapy: Secondary | ICD-10-CM | POA: Diagnosis not present

## 2020-12-11 DIAGNOSIS — Z20822 Contact with and (suspected) exposure to covid-19: Secondary | ICD-10-CM | POA: Insufficient documentation

## 2020-12-11 DIAGNOSIS — Z882 Allergy status to sulfonamides status: Secondary | ICD-10-CM | POA: Diagnosis not present

## 2020-12-11 DIAGNOSIS — Z01812 Encounter for preprocedural laboratory examination: Secondary | ICD-10-CM | POA: Insufficient documentation

## 2020-12-11 DIAGNOSIS — Z79890 Hormone replacement therapy (postmenopausal): Secondary | ICD-10-CM | POA: Diagnosis not present

## 2020-12-11 DIAGNOSIS — I10 Essential (primary) hypertension: Secondary | ICD-10-CM | POA: Diagnosis not present

## 2020-12-11 DIAGNOSIS — F418 Other specified anxiety disorders: Secondary | ICD-10-CM | POA: Diagnosis not present

## 2020-12-11 DIAGNOSIS — Z85850 Personal history of malignant neoplasm of thyroid: Secondary | ICD-10-CM | POA: Diagnosis not present

## 2020-12-11 DIAGNOSIS — E890 Postprocedural hypothyroidism: Secondary | ICD-10-CM | POA: Diagnosis not present

## 2020-12-11 DIAGNOSIS — F32A Depression, unspecified: Secondary | ICD-10-CM | POA: Diagnosis not present

## 2020-12-11 DIAGNOSIS — Z6841 Body Mass Index (BMI) 40.0 and over, adult: Secondary | ICD-10-CM | POA: Diagnosis not present

## 2020-12-11 DIAGNOSIS — F419 Anxiety disorder, unspecified: Secondary | ICD-10-CM | POA: Diagnosis not present

## 2020-12-11 DIAGNOSIS — K449 Diaphragmatic hernia without obstruction or gangrene: Secondary | ICD-10-CM | POA: Diagnosis not present

## 2020-12-11 DIAGNOSIS — Z8701 Personal history of pneumonia (recurrent): Secondary | ICD-10-CM | POA: Diagnosis not present

## 2020-12-11 DIAGNOSIS — Z880 Allergy status to penicillin: Secondary | ICD-10-CM | POA: Diagnosis not present

## 2020-12-11 LAB — SARS CORONAVIRUS 2 (TAT 6-24 HRS): SARS Coronavirus 2: NEGATIVE

## 2020-12-12 ENCOUNTER — Encounter (HOSPITAL_COMMUNITY)
Admission: RE | Disposition: A | Discharge status: Home / Self Care | Payer: Self-pay | Source: Home / Self Care | Attending: Surgery

## 2020-12-12 ENCOUNTER — Inpatient Hospital Stay (HOSPITAL_COMMUNITY): Payer: 59 | Admitting: Certified Registered Nurse Anesthetist

## 2020-12-12 ENCOUNTER — Other Ambulatory Visit: Payer: Self-pay

## 2020-12-12 ENCOUNTER — Inpatient Hospital Stay (HOSPITAL_COMMUNITY)
Admission: RE | Admit: 2020-12-12 | Discharge: 2020-12-13 | DRG: 621 | Disposition: A | Discharge status: Home / Self Care | Payer: 59 | Attending: Surgery | Admitting: Surgery

## 2020-12-12 ENCOUNTER — Encounter (HOSPITAL_COMMUNITY): Payer: Self-pay | Admitting: Surgery

## 2020-12-12 DIAGNOSIS — Z882 Allergy status to sulfonamides status: Secondary | ICD-10-CM | POA: Diagnosis not present

## 2020-12-12 DIAGNOSIS — Z8701 Personal history of pneumonia (recurrent): Secondary | ICD-10-CM | POA: Diagnosis not present

## 2020-12-12 DIAGNOSIS — E890 Postprocedural hypothyroidism: Secondary | ICD-10-CM | POA: Diagnosis present

## 2020-12-12 DIAGNOSIS — F419 Anxiety disorder, unspecified: Secondary | ICD-10-CM | POA: Diagnosis present

## 2020-12-12 DIAGNOSIS — Z79890 Hormone replacement therapy (postmenopausal): Secondary | ICD-10-CM

## 2020-12-12 DIAGNOSIS — Z20822 Contact with and (suspected) exposure to covid-19: Secondary | ICD-10-CM | POA: Diagnosis present

## 2020-12-12 DIAGNOSIS — Z9884 Bariatric surgery status: Secondary | ICD-10-CM

## 2020-12-12 DIAGNOSIS — Z85850 Personal history of malignant neoplasm of thyroid: Secondary | ICD-10-CM | POA: Diagnosis not present

## 2020-12-12 DIAGNOSIS — F32A Depression, unspecified: Secondary | ICD-10-CM | POA: Diagnosis present

## 2020-12-12 DIAGNOSIS — Z880 Allergy status to penicillin: Secondary | ICD-10-CM

## 2020-12-12 DIAGNOSIS — Z6841 Body Mass Index (BMI) 40.0 and over, adult: Secondary | ICD-10-CM

## 2020-12-12 DIAGNOSIS — K449 Diaphragmatic hernia without obstruction or gangrene: Secondary | ICD-10-CM | POA: Diagnosis present

## 2020-12-12 DIAGNOSIS — I10 Essential (primary) hypertension: Secondary | ICD-10-CM | POA: Diagnosis present

## 2020-12-12 DIAGNOSIS — Z79899 Other long term (current) drug therapy: Secondary | ICD-10-CM

## 2020-12-12 HISTORY — PX: UPPER GI ENDOSCOPY: SHX6162

## 2020-12-12 HISTORY — PX: LAPAROSCOPIC GASTRIC SLEEVE RESECTION: SHX5895

## 2020-12-12 LAB — CBC
HCT: 42 % (ref 36.0–46.0)
Hemoglobin: 14.1 g/dL (ref 12.0–15.0)
MCH: 30.7 pg (ref 26.0–34.0)
MCHC: 33.6 g/dL (ref 30.0–36.0)
MCV: 91.5 fL (ref 80.0–100.0)
Platelets: 296 10*3/uL (ref 150–400)
RBC: 4.59 MIL/uL (ref 3.87–5.11)
RDW: 13.2 % (ref 11.5–15.5)
WBC: 15.2 10*3/uL — ABNORMAL HIGH (ref 4.0–10.5)
nRBC: 0 % (ref 0.0–0.2)

## 2020-12-12 LAB — CREATININE, SERUM
Creatinine, Ser: 0.83 mg/dL (ref 0.44–1.00)
GFR, Estimated: >60 mL/min (ref >60)

## 2020-12-12 LAB — PREGNANCY, URINE: Preg Test, Ur: NEGATIVE

## 2020-12-12 LAB — ABO/RH: ABO/RH(D): A POS

## 2020-12-12 SURGERY — GASTRECTOMY, SLEEVE, LAPAROSCOPIC
Anesthesia: General | Site: Abdomen

## 2020-12-12 MED ORDER — MORPHINE SULFATE (PF) 2 MG/ML IV SOLN: 1.0000 mg | INTRAVENOUS | Status: DC | PRN

## 2020-12-12 MED ORDER — LIDOCAINE 2% (20 MG/ML) 5 ML SYRINGE
INTRAMUSCULAR | Status: DC | PRN
  Administered 2020-12-12: 1 mg/kg/h via INTRAVENOUS

## 2020-12-12 MED ORDER — ENSURE MAX PROTEIN PO LIQD
2.0000 [oz_av] | ORAL | Status: DC
  Administered 2020-12-13 (×3): 2 [oz_av] via ORAL

## 2020-12-12 MED ORDER — CHLORHEXIDINE GLUCONATE CLOTH 2 % EX PADS: 6.0000 | MEDICATED_PAD | Freq: Once | CUTANEOUS | Status: DC

## 2020-12-12 MED ORDER — 0.9 % SODIUM CHLORIDE (POUR BTL) OPTIME
TOPICAL | Status: DC | PRN
  Administered 2020-12-12: 15:00:00 1000 mL

## 2020-12-12 MED ORDER — BUPIVACAINE LIPOSOME 1.3 % IJ SUSP
20.0000 mL | Freq: Once | INTRAMUSCULAR | Status: DC
  Filled 2020-12-12: qty 20

## 2020-12-12 MED ORDER — LACTATED RINGERS IV SOLN: INTRAVENOUS | Status: DC | PRN

## 2020-12-12 MED ORDER — SODIUM CHLORIDE (PF) 0.9 % IJ SOLN
INTRAMUSCULAR | Status: DC | PRN
  Administered 2020-12-12: 10 mL

## 2020-12-12 MED ORDER — FENTANYL CITRATE (PF) 250 MCG/5ML IJ SOLN
INTRAMUSCULAR | Status: DC | PRN
  Administered 2020-12-12 (×2): 50 ug via INTRAVENOUS
  Administered 2020-12-12: 100 ug via INTRAVENOUS
  Administered 2020-12-12: 50 ug via INTRAVENOUS

## 2020-12-12 MED ORDER — ONDANSETRON HCL 4 MG/2ML IJ SOLN
4.0000 mg | INTRAMUSCULAR | Status: DC | PRN
  Administered 2020-12-12 – 2020-12-13 (×2): 4 mg via INTRAVENOUS
  Filled 2020-12-12 (×2): qty 2

## 2020-12-12 MED ORDER — GABAPENTIN 300 MG PO CAPS
300.0000 mg | ORAL_CAPSULE | ORAL | Status: AC
  Administered 2020-12-12: 14:00:00 300 mg via ORAL
  Filled 2020-12-12: qty 1

## 2020-12-12 MED ORDER — PROPOFOL 10 MG/ML IV BOLUS
INTRAVENOUS | Status: DC | PRN
  Administered 2020-12-12: 200 mg via INTRAVENOUS

## 2020-12-12 MED ORDER — EPHEDRINE 5 MG/ML INJ
INTRAVENOUS | Status: AC
  Filled 2020-12-12: qty 10

## 2020-12-12 MED ORDER — EPHEDRINE SULFATE-NACL 50-0.9 MG/10ML-% IV SOSY
PREFILLED_SYRINGE | INTRAVENOUS | Status: DC | PRN
  Administered 2020-12-12 (×2): 10 mg via INTRAVENOUS

## 2020-12-12 MED ORDER — ORAL CARE MOUTH RINSE: 15.0000 mL | Freq: Once | OROMUCOSAL | Status: AC

## 2020-12-12 MED ORDER — MIDAZOLAM HCL 2 MG/2ML IJ SOLN
INTRAMUSCULAR | Status: AC
  Filled 2020-12-12: qty 2

## 2020-12-12 MED ORDER — BUPIVACAINE LIPOSOME 1.3 % IJ SUSP
INTRAMUSCULAR | Status: DC | PRN
  Administered 2020-12-12: 20 mL

## 2020-12-12 MED ORDER — MIDAZOLAM HCL 5 MG/5ML IJ SOLN
INTRAMUSCULAR | Status: DC | PRN
  Administered 2020-12-12: 2 mg via INTRAVENOUS

## 2020-12-12 MED ORDER — ACETAMINOPHEN 500 MG PO TABS
1000.0000 mg | ORAL_TABLET | ORAL | Status: AC
  Administered 2020-12-12: 14:00:00 1000 mg via ORAL
  Filled 2020-12-12: qty 2

## 2020-12-12 MED ORDER — LIDOCAINE 2% (20 MG/ML) 5 ML SYRINGE
INTRAMUSCULAR | Status: DC | PRN
  Administered 2020-12-12: 80 mg via INTRAVENOUS

## 2020-12-12 MED ORDER — ONDANSETRON HCL 4 MG/2ML IJ SOLN
INTRAMUSCULAR | Status: DC | PRN
  Administered 2020-12-12: 4 mg via INTRAVENOUS

## 2020-12-12 MED ORDER — ACETAMINOPHEN 500 MG PO TABS
1000.0000 mg | ORAL_TABLET | Freq: Three times a day (TID) | ORAL | Status: DC
  Filled 2020-12-12: qty 2

## 2020-12-12 MED ORDER — LIDOCAINE HCL 2 % IJ SOLN
INTRAMUSCULAR | Status: AC
  Filled 2020-12-12: qty 20

## 2020-12-12 MED ORDER — APREPITANT 40 MG PO CAPS
40.0000 mg | ORAL_CAPSULE | ORAL | Status: AC
  Administered 2020-12-12: 14:00:00 40 mg via ORAL
  Filled 2020-12-12: qty 1

## 2020-12-12 MED ORDER — SCOPOLAMINE 1 MG/3DAYS TD PT72
1.0000 | MEDICATED_PATCH | TRANSDERMAL | Status: DC
  Administered 2020-12-12: 14:00:00 1.5 mg via TRANSDERMAL
  Filled 2020-12-12: qty 1

## 2020-12-12 MED ORDER — PANTOPRAZOLE SODIUM 40 MG IV SOLR
40.0000 mg | Freq: Every day | INTRAVENOUS | Status: DC
  Administered 2020-12-12: 21:00:00 40 mg via INTRAVENOUS
  Filled 2020-12-12 (×2): qty 40

## 2020-12-12 MED ORDER — KETAMINE HCL 10 MG/ML IJ SOLN
INTRAMUSCULAR | Status: AC
  Filled 2020-12-12: qty 1

## 2020-12-12 MED ORDER — HYDROMORPHONE HCL 1 MG/ML IJ SOLN
0.2500 mg | INTRAMUSCULAR | Status: DC | PRN
  Administered 2020-12-12: 0.25 mg via INTRAVENOUS

## 2020-12-12 MED ORDER — OXYCODONE HCL 5 MG PO TABS: 5.0000 mg | ORAL_TABLET | Freq: Once | ORAL | Status: DC | PRN

## 2020-12-12 MED ORDER — OXYCODONE HCL 5 MG/5ML PO SOLN
5.0000 mg | Freq: Four times a day (QID) | ORAL | Status: DC | PRN
  Administered 2020-12-12 – 2020-12-13 (×2): 5 mg via ORAL
  Filled 2020-12-12 (×2): qty 5

## 2020-12-12 MED ORDER — METOPROLOL TARTRATE 5 MG/5ML IV SOLN: 5.0000 mg | Freq: Four times a day (QID) | INTRAVENOUS | Status: DC | PRN

## 2020-12-12 MED ORDER — SODIUM CHLORIDE (PF) 0.9 % IJ SOLN
INTRAMUSCULAR | Status: AC
  Filled 2020-12-12: qty 10

## 2020-12-12 MED ORDER — FENTANYL CITRATE (PF) 250 MCG/5ML IJ SOLN
INTRAMUSCULAR | Status: AC
  Filled 2020-12-12: qty 5

## 2020-12-12 MED ORDER — HEPARIN SODIUM (PORCINE) 5000 UNIT/ML IJ SOLN
5000.0000 [IU] | Freq: Three times a day (TID) | INTRAMUSCULAR | Status: DC
  Administered 2020-12-12 – 2020-12-13 (×2): 5000 [IU] via SUBCUTANEOUS
  Filled 2020-12-12 (×2): qty 1

## 2020-12-12 MED ORDER — LACTATED RINGERS IV SOLN: INTRAVENOUS | Status: DC

## 2020-12-12 MED ORDER — PROMETHAZINE HCL 25 MG/ML IJ SOLN: 6.2500 mg | INTRAMUSCULAR | Status: DC | PRN

## 2020-12-12 MED ORDER — KETAMINE HCL 10 MG/ML IJ SOLN
INTRAMUSCULAR | Status: DC | PRN
  Administered 2020-12-12: 50 mg via INTRAVENOUS

## 2020-12-12 MED ORDER — ROCURONIUM BROMIDE 10 MG/ML (PF) SYRINGE
PREFILLED_SYRINGE | INTRAVENOUS | Status: DC | PRN
  Administered 2020-12-12: 80 mg via INTRAVENOUS
  Administered 2020-12-12: 20 mg via INTRAVENOUS

## 2020-12-12 MED ORDER — HEPARIN SODIUM (PORCINE) 5000 UNIT/ML IJ SOLN
5000.0000 [IU] | INTRAMUSCULAR | Status: AC
  Administered 2020-12-12: 14:00:00 5000 [IU] via SUBCUTANEOUS
  Filled 2020-12-12: qty 1

## 2020-12-12 MED ORDER — SODIUM CHLORIDE 0.9 % IV SOLN
2.0000 g | INTRAVENOUS | Status: AC
  Administered 2020-12-12: 15:00:00 2 g via INTRAVENOUS
  Filled 2020-12-12: qty 2

## 2020-12-12 MED ORDER — CHLORHEXIDINE GLUCONATE 0.12 % MT SOLN
15.0000 mL | Freq: Once | OROMUCOSAL | Status: AC
  Administered 2020-12-12: 14:00:00 15 mL via OROMUCOSAL

## 2020-12-12 MED ORDER — ACETAMINOPHEN 160 MG/5ML PO SOLN
1000.0000 mg | Freq: Three times a day (TID) | ORAL | Status: DC
  Administered 2020-12-12 – 2020-12-13 (×2): 1000 mg via ORAL
  Filled 2020-12-12 (×3): qty 40.6

## 2020-12-12 MED ORDER — DEXAMETHASONE SODIUM PHOSPHATE 10 MG/ML IJ SOLN
INTRAMUSCULAR | Status: AC
  Filled 2020-12-12: qty 1

## 2020-12-12 MED ORDER — HYDROMORPHONE HCL 1 MG/ML IJ SOLN
INTRAMUSCULAR | Status: AC
  Filled 2020-12-12: qty 1

## 2020-12-12 MED ORDER — LACTATED RINGERS IR SOLN
Status: DC | PRN
  Administered 2020-12-12: 1000 mL

## 2020-12-12 MED ORDER — AMISULPRIDE (ANTIEMETIC) 5 MG/2ML IV SOLN: 10.0000 mg | Freq: Once | INTRAVENOUS | Status: DC | PRN

## 2020-12-12 MED ORDER — OXYCODONE HCL 5 MG/5ML PO SOLN: 5.0000 mg | Freq: Once | ORAL | Status: DC | PRN

## 2020-12-12 MED ORDER — ONDANSETRON HCL 4 MG/2ML IJ SOLN
INTRAMUSCULAR | Status: AC
  Filled 2020-12-12: qty 2

## 2020-12-12 MED ORDER — DEXAMETHASONE SODIUM PHOSPHATE 10 MG/ML IJ SOLN
INTRAMUSCULAR | Status: DC | PRN
  Administered 2020-12-12: 10 mg via INTRAVENOUS

## 2020-12-12 MED ORDER — SUGAMMADEX SODIUM 200 MG/2ML IV SOLN
INTRAVENOUS | Status: DC | PRN
  Administered 2020-12-12: 300 mg via INTRAVENOUS

## 2020-12-12 MED ORDER — KCL IN DEXTROSE-NACL 20-5-0.45 MEQ/L-%-% IV SOLN
INTRAVENOUS | Status: DC
  Filled 2020-12-12: qty 1000

## 2020-12-12 MED ORDER — SODIUM CHLORIDE 0.9 % IV SOLN: 20.0000 mL | Freq: Once | INTRAVENOUS | Status: DC

## 2020-12-12 SURGICAL SUPPLY — 60 items
APPLICATOR COTTON TIP 6 STRL (MISCELLANEOUS) IMPLANT
APPLICATOR COTTON TIP 6IN STRL (MISCELLANEOUS)
APPLIER CLIP 5 13 M/L LIGAMAX5 (MISCELLANEOUS)
APPLIER CLIP ROT 10 11.4 M/L (STAPLE)
APPLIER CLIP ROT 13.4 12 LRG (CLIP)
BLADE SURG 15 STRL LF DISP TIS (BLADE) ×2 IMPLANT
BLADE SURG 15 STRL SS (BLADE) ×1
CABLE HIGH FREQUENCY MONO STRZ (ELECTRODE) ×3 IMPLANT
CLIP APPLIE 5 13 M/L LIGAMAX5 (MISCELLANEOUS) IMPLANT
CLIP APPLIE ROT 10 11.4 M/L (STAPLE) IMPLANT
CLIP APPLIE ROT 13.4 12 LRG (CLIP) IMPLANT
COVER WAND RF STERILE (DRAPES) IMPLANT
DECANTER SPIKE VIAL GLASS SM (MISCELLANEOUS) IMPLANT
DERMABOND ADVANCED (GAUZE/BANDAGES/DRESSINGS)
DERMABOND ADVANCED .7 DNX12 (GAUZE/BANDAGES/DRESSINGS) IMPLANT
DEVICE SUT QUICK LOAD TK 5 (STAPLE) IMPLANT
DEVICE SUT TI-KNOT TK 5X26 (MISCELLANEOUS) IMPLANT
DEVICE SUTURE ENDOST 10MM (ENDOMECHANICALS) IMPLANT
DISSECTOR BLUNT TIP ENDO 5MM (MISCELLANEOUS) IMPLANT
ELECT REM PT RETURN 15FT ADLT (MISCELLANEOUS) ×3 IMPLANT
GAUZE SPONGE 4X4 12PLY STRL (GAUZE/BANDAGES/DRESSINGS) IMPLANT
GLOVE BIOGEL M 8.0 STRL (GLOVE) ×3 IMPLANT
GOWN STRL REUS W/TWL XL LVL3 (GOWN DISPOSABLE) ×12 IMPLANT
GRASPER SUT TROCAR 14GX15 (MISCELLANEOUS) ×3 IMPLANT
HANDLE STAPLE EGIA 4 XL (STAPLE) ×3 IMPLANT
KIT BASIN OR (CUSTOM PROCEDURE TRAY) ×3 IMPLANT
KIT TURNOVER KIT A (KITS) ×3 IMPLANT
MARKER SKIN DUAL TIP RULER LAB (MISCELLANEOUS) ×3 IMPLANT
MAT PREVALON FULL STRYKER (MISCELLANEOUS) ×3 IMPLANT
NEEDLE SPNL 22GX3.5 QUINCKE BK (NEEDLE) ×3 IMPLANT
PACK CARDIOVASCULAR III (CUSTOM PROCEDURE TRAY) ×3 IMPLANT
RELOAD TRI 45 ART MED THCK BLK (STAPLE) ×3 IMPLANT
RELOAD TRI 45 ART MED THCK PUR (STAPLE) IMPLANT
RELOAD TRI 60 ART MED THCK BLK (STAPLE) ×3 IMPLANT
RELOAD TRI 60 ART MED THCK PUR (STAPLE) ×9 IMPLANT
SCISSORS LAP 5X45 EPIX DISP (ENDOMECHANICALS) IMPLANT
SET IRRIG TUBING LAPAROSCOPIC (IRRIGATION / IRRIGATOR) ×3 IMPLANT
SET TUBE SMOKE EVAC HIGH FLOW (TUBING) ×3 IMPLANT
SHEARS HARMONIC ACE PLUS 45CM (MISCELLANEOUS) ×3 IMPLANT
SLEEVE ADV FIXATION 5X100MM (TROCAR) ×6 IMPLANT
SLEEVE GASTRECTOMY 36FR VISIGI (MISCELLANEOUS) ×3 IMPLANT
SOL ANTI FOG 6CC (MISCELLANEOUS) ×2 IMPLANT
SOLUTION ANTI FOG 6CC (MISCELLANEOUS) ×1
SPONGE LAP 18X18 RF (DISPOSABLE) ×3 IMPLANT
STAPLER VISISTAT 35W (STAPLE) IMPLANT
SUT MNCRL AB 4-0 PS2 18 (SUTURE) ×6 IMPLANT
SUT SURGIDAC NAB ES-9 0 48 120 (SUTURE) IMPLANT
SUT VICRYL 0 TIES 12 18 (SUTURE) ×3 IMPLANT
SYR 10ML ECCENTRIC (SYRINGE) ×3 IMPLANT
SYR 20ML LL LF (SYRINGE) ×3 IMPLANT
SYR 50ML LL SCALE MARK (SYRINGE) ×3 IMPLANT
TOWEL OR 17X26 10 PK STRL BLUE (TOWEL DISPOSABLE) ×3 IMPLANT
TOWEL OR NON WOVEN STRL DISP B (DISPOSABLE) ×3 IMPLANT
TRAY FOLEY MTR SLVR 16FR STAT (SET/KITS/TRAYS/PACK) IMPLANT
TROCAR ADV FIXATION 5X100MM (TROCAR) ×3 IMPLANT
TROCAR BLADELESS 15MM (ENDOMECHANICALS) ×3 IMPLANT
TROCAR BLADELESS OPT 5 100 (ENDOMECHANICALS) ×3 IMPLANT
TUBE CALIBRATION LAPBAND (TUBING) IMPLANT
TUBING CONNECTING 10 (TUBING) ×6 IMPLANT
TUBING ENDO SMARTCAP (MISCELLANEOUS) ×3 IMPLANT

## 2020-12-12 NOTE — Progress Notes (Signed)
PHARMACY CONSULT FOR:  Risk Assessment for Post-Discharge VTE Following Bariatric Surgery  Post-Discharge VTE Risk Assessment: This patient's probability of 30-day post-discharge VTE is increased due to the factors marked:   Female    Age >/=60 years    BMI >/=50 kg/m2    CHF    Dyspnea at Rest    Paraplegia  X  Non-gastric-band surgery    Operation Time >/=3 hr    Return to OR     Length of Stay >/= 3 d   Hx of VTE   Hypercoagulable condition   Significant venous stasis    Predicted probability of 30-day post-discharge VTE: 0.16%, mild  Other patient-specific factors to consider:  Recommendation for Discharge: No pharmacologic prophylaxis post-discharge     Sabrina [name] is a 39 y.o. female who underwent  laparoscopic sleeve gastrectomy on 12/12/20 Case start: 1521 Case end: 1632   Allergies  Allergen Reactions  . Sulfa Antibiotics Nausea And Vomiting  . Penicillins Rash    Patient Measurements: Height: 5\' 4"  (162.6 cm) Weight: 129.4 kg (285 lb 3.2 oz) IBW/kg (Calculated) : 54.7 Body mass index is 48.95 kg/m.  No results for input(s): WBC, HGB, HCT, PLT, APTT, CREATININE, LABCREA, CREATININE, CREAT24HRUR, MG, PHOS, ALBUMIN, PROT, ALBUMIN, AST, ALT, ALKPHOS, BILITOT, BILIDIR, IBILI in the last 72 hours. Estimated Creatinine Clearance: 123.6 mL/min (by C-G formula based on SCr of 0.66 mg/dL).    Past Medical History:  Diagnosis Date  . Anxiety   . Cancer (HCC)    thyroid  . Depression   . Heart murmur    as a baby  . Hypertension   . Pneumonia    as a child  . PONV (postoperative nausea and vomiting)      Medications Prior to Admission  Medication Sig Dispense Refill Last Dose  . BIOTIN PO Take 1 tablet by mouth daily.   Past Week at Unknown time  . buPROPion (WELLBUTRIN SR) 150 MG 12 hr tablet Take 150 mg by mouth 2 (two) times daily.  2 12/12/2020 at Unknown time  . Cholecalciferol (VITAMIN D3 PO) Take 1 tablet by mouth daily.   Past Week at  Unknown time  . citalopram (CELEXA) 40 MG tablet Take 40 mg by mouth daily.   12/12/2020 at Unknown time  . ibuprofen (ADVIL) 200 MG tablet Take 400-600 mg by mouth every 8 (eight) hours as needed (for pain.).   Past Month at Unknown time  . levothyroxine (SYNTHROID, LEVOTHROID) 137 MCG tablet Take 274 mcg by mouth daily before breakfast.   12/12/2020 at Unknown time  . metoprolol succinate (TOPROL-XL) 50 MG 24 hr tablet Take 50 mg by mouth daily.   12/12/2020 at 0930  . Multiple Vitamin (MULTIVITAMIN WITH MINERALS) TABS tablet Take 1 tablet by mouth daily.   12/11/2020 at Unknown time  . vitamin B-12 (CYANOCOBALAMIN) 1000 MCG tablet Take 1,000 mcg by mouth daily.   Past Week at Unknown time  . clonazePAM (KLONOPIN) 0.5 MG tablet Take 0.25 mg by mouth 2 (two) times daily as needed for anxiety.   More than a month at Unknown time     12/13/2020 PharmD, BCPS Clinical Pharmacist [facility] main pharmacy [phone] 12/12/2020 6:35 PM

## 2020-12-12 NOTE — Transfer of Care (Signed)
Immediate Anesthesia Transfer of Care Note  Patient: Sabrina [name]  Procedure(s) Performed: LAPAROSCOPIC GASTRIC SLEEVE RESECTION (N/A Abdomen) UPPER GI ENDOSCOPY (N/A )  Patient Location: PACU  Anesthesia Type:General  Level of Consciousness: awake, alert , oriented and patient cooperative  Airway & Oxygen Therapy: Patient Spontanous Breathing and Patient connected to face mask oxygen  Post-op Assessment: Report given to RN, Post -op Vital signs reviewed and stable and Patient moving all extremities X 4  Post vital signs: stable  Last Vitals:  Vitals Value Taken Time  BP 132/73 12/12/20 1645  Temp 36.7 C 12/12/20 1641  Pulse 75 12/12/20 1646  Resp 16 12/12/20 1646  SpO2 100 % 12/12/20 1646  Vitals shown include unvalidated device data.  Last Pain:  Vitals:   12/12/20 1320  TempSrc:   PainSc: 0-No pain         Complications: No complications documented.

## 2020-12-12 NOTE — Anesthesia Preprocedure Evaluation (Signed)
Anesthesia Evaluation  Patient identified by MRN, date of birth, ID band Patient awake    Reviewed: Allergy & Precautions, H&P , NPO status , Patient's Chart, lab work & pertinent test results  History of Anesthesia Complications (+) PONV and history of anesthetic complications  Airway Mallampati: II  TM Distance: >3 FB Neck ROM: Full   Comment: Full neck Dental no notable dental hx.    Pulmonary neg pulmonary ROS,    Pulmonary exam normal breath sounds clear to auscultation       Cardiovascular hypertension, negative cardio ROS Normal cardiovascular exam Rhythm:Regular Rate:Normal     Neuro/Psych PSYCHIATRIC DISORDERS Anxiety Depression negative neurological ROS     GI/Hepatic negative GI ROS, Neg liver ROS,   Endo/Other  Morbid obesity  Renal/GU negative Renal ROS  negative genitourinary   Musculoskeletal negative musculoskeletal ROS (+)   Abdominal (+) + obese,   Peds negative pediatric ROS (+)  Hematology negative hematology ROS (+)   Anesthesia Other Findings   Reproductive/Obstetrics negative OB ROS                            Anesthesia Physical Anesthesia Plan  ASA: II  Anesthesia Plan: General   Post-op Pain Management:    Induction: Intravenous  PONV Risk Score and Plan: Scopolamine patch - Pre-op, Midazolam, Dexamethasone, Ondansetron and Treatment may vary due to age or medical condition  Airway Management Planned: Oral ETT  Additional Equipment:   Intra-op Plan:   Post-operative Plan: Extubation in OR  Informed Consent: I have reviewed the patients History and Physical, chart, labs and discussed the procedure including the risks, benefits and alternatives for the proposed anesthesia with the patient or authorized representative who has indicated his/her understanding and acceptance.     Dental advisory given  Plan Discussed with: CRNA and  Anesthesiologist  Anesthesia Plan Comments:        Anesthesia Quick Evaluation

## 2020-12-12 NOTE — Anesthesia Procedure Notes (Signed)
Procedure Name: Intubation Date/Time: 12/12/2020 3:08 PM Performed by: Mitzie Na, CRNA Pre-anesthesia Checklist: Patient identified, Emergency Drugs available, Suction available and Patient being monitored Patient Re-evaluated:Patient Re-evaluated prior to induction Oxygen Delivery Method: Circle system utilized Preoxygenation: Pre-oxygenation with 100% oxygen Induction Type: IV induction Ventilation: Mask ventilation without difficulty and Oral airway inserted - appropriate to patient size Laryngoscope Size: Mac and 3 Grade View: Grade I Tube type: Oral Tube size: 7.5 mm Number of attempts: 1 Airway Equipment and Method: Stylet and Oral airway Placement Confirmation: ETT inserted through vocal cords under direct vision,  positive ETCO2 and breath sounds checked- equal and bilateral Secured at: 24 cm Tube secured with: Tape Dental Injury: Teeth and Oropharynx as per pre-operative assessment

## 2020-12-12 NOTE — Op Note (Signed)
° °  Patient: Sabrina [name] (05-19-1981, [id number])  Date of Surgery: 12/12/2020   Preoperative Diagnosis: MORBID OBESITY   Postoperative Diagnosis: MORBID OBESITY   Surgical Procedure: Upper Endoscopy   Surgeon: Luretha Murphy, MD Endoscopist:  Ivar Drape, MD  Anesthesiologist: Mellody Dance, MD CRNA: Lorelee Market, CRNA; Sudie Grumbling, CRNA   Anesthesia: General   Fluids:  No intake/output data recorded.  Complications: None  Drains:  None  Specimen: None   Indications for Procedure: Sabrina [name] is a 39 y.o. female undergoing laparoscopic sleeve gastrectomy and an EGD was requested to evaluate foregut anatomy intraoperatively.  Description of Procedure: During the procedure, I scrubbed out and obtained the Olympus endoscope. I gently placed endoscope in the patient's oropharynx and gently glided it down the esophagus without any difficulty under direct visualization.  The scope was advanced as far as the pylorus and then slowly withdrawn to inspect the foregut anatomy.  Dr. Daphine Deutscher had placed saline in the upper abdomen and all staple lines were submerged to ensure no air leak. There was no evidence of bubbles. There was no evidence of intraluminal bleeding and the mucosa appeared healthy.  The lumen was widely patent without evidence of stricture.  The intraluminal insufflation was decompressed. The scope was withdrawn. The patient tolerated this portion of the procedure well. Please see Dr Ermalene Searing operative note for details regarding the remainder of the procedure.    Ivar Drape, MD General, Bariatric, & Minimally Invasive Surgery [facility], [place]

## 2020-12-12 NOTE — Op Note (Signed)
12 December 2020  Surgeon: Wenda Low, MD, FACS  Asst:  Ivar Drape, MD  Anes:  General endotracheal  Procedure: Laparoscopic sleeve gastrectomy and upper endoscopy  Diagnosis: Morbid obesity  Complications: none  EBL:   minimal cc  Description of Procedure:  The patient was take to OR 2 and given general anesthesia.  The abdomen was prepped with Technicare and draped sterilely.  A timeout was performed.  Access to the abdomen was achieved with a 5 mm Optiview through the left upper quadrant.  Following insufflation, the state of the abdomen was found to be with a few adhesions which were taken down.  The calibration tube was inserted and the test for hiatal hernia was negative with 15 and 10 cc of air in the balloon.  The ViSiGi 36Fr tube was inserted to deflate the stomach and was pulled back into the esophagus.    The pylorus was identified and we measured 5 cm back and marked the antrum.  At that point we began dissection to take down the greater curvature of the stomach using the Harmonic scalpel.  This dissection was taken all the way up to the left crus.  Posterior attachments of the stomach were also taken down.    The ViSiGi tube was then passed into the antrum and suction applied so that it was snug along the lessor curvature.  The "crow's foot" or incisura was identified.  The sleeve gastrectomy was begun using the [employer] stapler beginning with a 4.5 cm black load with TRS followed by a 6 cm black load with TRS.  The remainder of the sleeve was completed with purple loads with TRS.Marland Kitchen  When the sleeve was complete the tube was taken off suction and insufflated briefly.  The tube was withdrawn.  Upper endoscopy was then performed by Dr. Dossie Der.     The specimen was extracted through the 15 trocar site.  Local was provided by infiltrating with Exparel diluted to 30 cc and closed 4-0 Monocryl and Dermabond.    Matt B. Daphine Deutscher, MD, [facility]  [facility], [place] [phone]

## 2020-12-12 NOTE — Anesthesia Postprocedure Evaluation (Signed)
Anesthesia Post Note  Patient: Ziyan Hillmer Stairs  Procedure(s) Performed: LAPAROSCOPIC GASTRIC SLEEVE RESECTION (N/A Abdomen) UPPER GI ENDOSCOPY (N/A )     Patient location during evaluation: PACU Anesthesia Type: General Level of consciousness: sedated, patient cooperative and oriented Pain management: pain level controlled Vital Signs Assessment: post-procedure vital signs reviewed and stable Respiratory status: spontaneous breathing, nonlabored ventilation and respiratory function stable Cardiovascular status: blood pressure returned to baseline and stable Postop Assessment: no apparent nausea or vomiting Anesthetic complications: no   No complications documented.  Last Vitals:  Vitals:   12/12/20 1715 12/12/20 1730  BP: 132/82 133/87  Pulse: 70 71  Resp: 17 20  Temp:  37 C  SpO2: 96% 94%    Last Pain:  Vitals:   12/12/20 1730  TempSrc:   PainSc: Asleep                 Lecretia Buczek,E. Bartholomew Ramesh

## 2020-12-12 NOTE — Discharge Instructions (Signed)
° ° ° °GASTRIC BYPASS/SLEEVE ° Home Care Instructions ° ° These instructions are to help you care for yourself when you go home. ° °Call: If you have any problems. °• Call [phone] and ask for the surgeon on call °• If you need immediate help, come to the ER at [facility].  °• Tell the ER staff that you are a new post-op gastric bypass or gastric sleeve patient °  °Signs and symptoms to report: • Severe vomiting or nausea °o If you cannot keep down clear liquids for longer than 1 day, call your surgeon  °• Abdominal pain that does not get better after taking your pain medication °• Fever over 100.4° F with chills °• Heart beating over 100 beats a minute °• Shortness of breath at rest °• Chest pain °•  Redness, swelling, drainage, or foul odor at incision (surgical) sites °•  If your incisions open or pull apart °• Swelling or pain in calf (lower leg) °• Diarrhea (Loose bowel movements that happen often), frequent watery, uncontrolled bowel movements °• Constipation, (no bowel movements for 3 days) if this happens: Pick one °o Milk of Magnesia, 2 tablespoons by mouth, 3 times a day for 2 days if needed °o Stop taking Milk of Magnesia once you have a bowel movement °o Call your doctor if constipation continues °Or °o Miralax  (instead of Milk of Magnesia) following the label instructions °o Stop taking Miralax once you have a bowel movement °o Call your doctor if constipation continues °• Anything you think is not normal °  °Normal side effects after surgery: • Unable to sleep at night or unable to focus °• Irritability or moody °• Being tearful (crying) or depressed °These are common complaints, possibly related to your anesthesia medications that put you to sleep, stress of surgery, and change in lifestyle.  This usually goes away a few weeks after surgery.  If these feelings continue, call your primary care doctor. °  °Wound Care: You may have surgical glue, steri-strips, or staples over your incisions after  surgery °• Surgical glue:  Looks like a clear film over your incisions and will wear off a little at a time °• Steri-strips: Strips of tape over your incisions. You may notice a yellowish color on the skin under the steri-strips. This is used to make the   steri-strips stick better. Do not pull the steri-strips off - let them fall off °• Staples: Staples may be removed before you leave the hospital °o If you go home with staples, call [facility], [phone] at for an appointment with your surgeon’s nurse to have staples removed 10 days after surgery. °• Showering: You may shower two (2) days after your surgery unless your surgeon tells you differently °o Wash gently around incisions with warm soapy water, rinse well, and gently pat dry  °o No tub baths until staples are removed, steri-strips fall off or glue is gone.  °  °Medications: • Medications should be liquid or crushed if larger than the size of a dime °• Extended release pills (medication that release a little bit at a time through the day) should NOT be crushed or cut. (examples include XL, ER, DR, SR) °• Depending on the size and number of medications you take, you may need to space (take a few throughout the day)/change the time you take your medications so that you do not over-fill your pouch (smaller stomach) °• Make sure you follow-up with your primary care doctor to   make medication changes needed during rapid weight loss and life-style changes °• If you have diabetes, follow up with the doctor that orders your diabetes medication(s) within one week after surgery and check your blood sugar regularly. °• Do not drive while taking prescription pain medication  °• It is ok to take Tylenol by the bottle instructions with your pain medicine or instead of your pain medicine as needed.  DO NOT TAKE NSAIDS (EXAMPLES OF NSAIDS:  IBUPROFREN/ NAPROXEN)  °Diet:                    First 2 Weeks ° You will see the dietician t about two (2) weeks  after your surgery. The dietician will increase the types of foods you can eat if you are handling liquids well: °• If you have severe vomiting or nausea and cannot keep down clear liquids lasting longer than 1 day, call your surgeon @ ([phone]) °Protein Shake °• Drink at least 2 ounces of shake 5-6 times per day °• Each serving of protein shakes (usually 8 - 12 ounces) should have: °o 15 grams of protein  °o And no more than 5 grams of carbohydrate  °• Goal for protein each day: °o Men = 80 grams per day °o Women = 60 grams per day °• Protein powder may be added to fluids such as non-fat milk or Lactaid milk or unsweetened Soy/Almond milk (limit to 35 grams added protein powder per serving) ° °Hydration °• Slowly increase the amount of water and other clear liquids as tolerated (See Acceptable Fluids) °• Slowly increase the amount of protein shake as tolerated  °•  Sip fluids slowly and throughout the day.  Do not use straws. °• May use sugar substitutes in small amounts (no more than 6 - 8 packets per day; i.e. Splenda) ° °Fluid Goal °• The first goal is to drink at least 8 ounces of protein shake/drink per day (or as directed by the nutritionist); some examples of protein shakes are Syntrax Nectar, Adkins Advantage, EAS Edge HP, and Unjury. See handout from pre-op Bariatric Education Class: °o Slowly increase the amount of protein shake you drink as tolerated °o You may find it easier to slowly sip shakes throughout the day °o It is important to get your proteins in first °• Your fluid goal is to drink 64 - 100 ounces of fluid daily °o It may take a few weeks to build up to this °• 32 oz (or more) should be clear liquids  °And  °• 32 oz (or more) should be full liquids (see below for examples) °• Liquids should not contain sugar, caffeine, or carbonation ° °Clear Liquids: °• Water or Sugar-free flavored water (i.e. Fruit H2O, Propel) °• Decaffeinated coffee or tea (sugar-free) °• Crystal Lite, Wyler’s Lite,  Minute Maid Lite °• Sugar-free Jell-O °• Bouillon or broth °• Sugar-free Popsicle:   *Less than 20 calories each; Limit 1 per day ° °Full Liquids: °Protein Shakes/Drinks + 2 choices per day of other full liquids °• Full liquids must be: °o No More Than 15 grams of Carbs per serving  °o No More Than 3 grams of Fat per serving °• Strained low-fat cream soup (except Cream of Potato or Tomato) °• Non-Fat milk °• Fat-free Lactaid Milk °• Unsweetened Soy Or Unsweetened Almond Milk °• Low Sugar yogurt (Dannon Lite & Fit, Greek yogurt; Oikos Triple Zero; Chobani Simply 100; Yoplait 100 calorie Greek - No Fruit on the Bottom) ° °  °Vitamins   and Minerals • Start 1 day after surgery unless otherwise directed by your surgeon °• Chewable Bariatric Specific Multivitamin / Multimineral Supplement with iron (Example: Bariatric Advantage Multi EA) °• Chewable Calcium with Vitamin D-3 °(Example: 3 Chewable Calcium Plus 600 with Vitamin D-3) °o Take 500 mg three (3) times a day for a total of 1500 mg each day °o Do not take all 3 doses of calcium at one time as it may cause constipation, and you can only absorb 500 mg  at a time  °o Do not mix multivitamins containing iron with calcium supplements; take 2 hours apart °• Menstruating women and those with a history of anemia (a blood disease that causes weakness) may need extra iron °o Talk with your doctor to see if you need more iron °• Do not stop taking or change any vitamins or minerals until you talk to your dietitian or surgeon °• Your Dietitian and/or surgeon must approve all vitamin and mineral supplements °  °Activity and Exercise: Limit your physical activity as instructed by your doctor.  It is important to continue walking at home.  During this time, use these guidelines: °• Do not lift anything greater than ten (10) pounds for at least two (2) weeks °• Do not go back to work or drive until your surgeon says you can °• You may have sex when you feel comfortable  °o It is  VERY important for female patients to use a reliable birth control method; fertility often increases after surgery  °o All hormonal birth control will be ineffective for 30 days after surgery due to medications given during surgery a barrier method must be used. °o Do not get pregnant for at least 18 months °• Start exercising as soon as your doctor tells you that you can °o Make sure your doctor approves any physical activity °• Start with a simple walking program °• Walk 5-15 minutes each day, 7 days per week.  °• Slowly increase until you are walking 30-45 minutes per day °Consider joining our BELT program. [phone] or email belt@[employer] °  °Special Instructions Things to remember: °• Use your CPAP when sleeping if this applies to you ° °• [facility] has two free Bariatric Surgery Support Groups that meet monthly °o The 3rd Thursday of each month, 6 pm °• It is very important to keep all follow up appointments with your surgeon, dietitian, primary care physician, and behavioral health practitioner °• Routine follow up schedule with your surgeon include appointments at 2-3 weeks, 6-8 weeks, 6 months, and 1 year at a minimum.  Your surgeon may request to see you more often.   °• After the first year, please follow up with your bariatric surgeon and dietitian at least once a year in order to maintain best weight loss results ° ° °[facility]: [phone] °[facility]: [phone] °Bariatric Nurse Coordinator: [phone] °  °   Reviewed and Endorsed  °by [place] Patient Education Committee, June, 2016 °Edits Approved: Aug, 2018 ° ° ° °

## 2020-12-12 NOTE — Interval H&P Note (Signed)
History and Physical Interval Note:  12/12/2020 2:47 PM  Sabrina [name]  has presented today for surgery, with the diagnosis of MORBID OBESITY.  The various methods of treatment have been discussed with the patient and family. After consideration of risks, benefits and other options for treatment, the patient has consented to  Procedure(s): LAPAROSCOPIC GASTRIC SLEEVE RESECTION (N/A) UPPER GI ENDOSCOPY (N/A) as a surgical intervention.  The patient's history has been reviewed, patient examined, no change in status, stable for surgery.  I have reviewed the patient's chart and labs.  Questions were answered to the patient's satisfaction.     Valarie Merino

## 2020-12-13 ENCOUNTER — Encounter (HOSPITAL_COMMUNITY): Payer: Self-pay | Admitting: Surgery

## 2020-12-13 ENCOUNTER — Other Ambulatory Visit (HOSPITAL_COMMUNITY): Payer: Self-pay | Admitting: Surgery

## 2020-12-13 LAB — CBC WITH DIFFERENTIAL/PLATELET
Abs Immature Granulocytes: 0.02 10*3/uL (ref 0.00–0.07)
Basophils Absolute: 0 10*3/uL (ref 0.0–0.1)
Basophils Relative: 0 %
Eosinophils Absolute: 0 10*3/uL (ref 0.0–0.5)
Eosinophils Relative: 0 %
HCT: 40.3 % (ref 36.0–46.0)
Hemoglobin: 13 g/dL (ref 12.0–15.0)
Immature Granulocytes: 0 %
Lymphocytes Relative: 14 %
Lymphs Abs: 1.4 10*3/uL (ref 0.7–4.0)
MCH: 30.3 pg (ref 26.0–34.0)
MCHC: 32.3 g/dL (ref 30.0–36.0)
MCV: 93.9 fL (ref 80.0–100.0)
Monocytes Absolute: 0.6 10*3/uL (ref 0.1–1.0)
Monocytes Relative: 6 %
Neutro Abs: 8.2 10*3/uL — ABNORMAL HIGH (ref 1.7–7.7)
Neutrophils Relative %: 80 %
Platelets: 327 10*3/uL (ref 150–400)
RBC: 4.29 MIL/uL (ref 3.87–5.11)
RDW: 13.2 % (ref 11.5–15.5)
WBC: 10.2 10*3/uL (ref 4.0–10.5)
nRBC: 0 % (ref 0.0–0.2)

## 2020-12-13 LAB — SURGICAL PATHOLOGY

## 2020-12-13 MED ORDER — ONDANSETRON 4 MG PO TBDP: 4.0000 mg | ORAL_TABLET | Freq: Four times a day (QID) | ORAL | 0 refills | Status: AC | PRN

## 2020-12-13 MED ORDER — OXYCODONE HCL 5 MG PO TABS: 5.0000 mg | ORAL_TABLET | Freq: Four times a day (QID) | ORAL | 0 refills | Status: DC | PRN

## 2020-12-13 MED ORDER — PANTOPRAZOLE SODIUM 40 MG PO TBEC: 40.0000 mg | DELAYED_RELEASE_TABLET | Freq: Every day | ORAL | 0 refills | Status: DC

## 2020-12-13 MED FILL — ONDANSETRON ODT 4 MG TABLET: 4 | 5 days supply | Qty: 20 | Fill #0

## 2020-12-13 MED FILL — oxyCODONE HCL 5 MG TABS: 5 | 2 days supply | Qty: 10 | Fill #0

## 2020-12-13 MED FILL — PANTOPRAZOLE SOD DR 40 MG T: 40 | 90 days supply | Qty: 90 | Fill #0

## 2020-12-13 NOTE — Progress Notes (Signed)
Patient alert and oriented, Post op day 1.  Provided support and encouragement.  Encouraged pulmonary toilet, ambulation and small sips of liquids.  Completed 12 ounces of bari clear fluid and 2 ounces of protein.  All questions answered.  Will continue to monitor. 

## 2020-12-13 NOTE — Progress Notes (Signed)
Patient alert and oriented, pain is controlled. Patient is tolerating fluids, advanced to protein shake today, patient is tolerating well. Reviewed Gastric Bypass discharge instructions with patient and patient is able to articulate understanding. Provided information on BELT program, Support Group and [facility] outpatient pharmacy. All questions answered, will continue to monitor.    

## 2020-12-13 NOTE — Progress Notes (Signed)
Nutrition Note  RD consulted for diet education for patient s/p bariatric surgery. Bariatric nurse coordinator providing education.  If nutrition issues arise, please consult RD.   Raun Routh, MS, RD, LDN Inpatient Clinical Dietitian Contact information available via Amion  

## 2020-12-13 NOTE — Discharge Summary (Signed)
Physician Discharge Summary  Patient ID: Sabrina [name] MRN: [id number] DOB/AGE: 1981-09-09 39 y.o.  PCP: Kirstie Peri, MD  Admit date: 12/12/2020 Discharge date: 12/13/2020  Admission Diagnoses:  obestiy  Discharge Diagnoses:  same  Principal Problem:   S/P laparoscopic sleeve gastrectomyDec 2021   Surgery:  Lap sleeve gastrectomy  Discharged Condition: improved  Hospital Course:   Had surgery on Tuesday.  Begun on clear and advanced to protein shakes and ready for discharge on PD 1  Consults: none  Significant Diagnostic Studies: none    Discharge Exam: Blood pressure 138/83, pulse (!) 52, temperature 98.2 F (36.8 C), temperature source Oral, resp. rate 18, height 5\' 4"  (1.626 m), weight 129.4 kg, last menstrual period 11/24/2020, SpO2 99 %. Doing very well.  Ready for discharge.    Disposition: Discharge disposition: 01-Home or Self Care       Discharge Instructions     Ambulate hourly while awake   Complete by: As directed    Call MD for:  difficulty breathing, headache or visual disturbances   Complete by: As directed    Call MD for:  persistant dizziness or light-headedness   Complete by: As directed    Call MD for:  persistant nausea and vomiting   Complete by: As directed    Call MD for:  redness, tenderness, or signs of infection (pain, swelling, redness, odor or green/yellow discharge around incision site)   Complete by: As directed    Call MD for:  severe uncontrolled pain   Complete by: As directed    Call MD for:  temperature >101 F   Complete by: As directed    Diet bariatric full liquid   Complete by: As directed    Incentive spirometry   Complete by: As directed    Perform hourly while awake      Allergies as of 12/13/2020       Reactions   Sulfa Antibiotics Nausea And Vomiting   Penicillins Rash        Medication List     TAKE these medications    BIOTIN PO Take 1 tablet by mouth daily.   buPROPion 150 MG 12 hr  tablet Commonly known as: WELLBUTRIN SR Take 150 mg by mouth 2 (two) times daily.   citalopram 40 MG tablet Commonly known as: CELEXA Take 40 mg by mouth daily.   clonazePAM 0.5 MG tablet Commonly known as: KLONOPIN Take 0.25 mg by mouth 2 (two) times daily as needed for anxiety.   ibuprofen 200 MG tablet Commonly known as: ADVIL Take 400-600 mg by mouth every 8 (eight) hours as needed (for pain.). Notes to patient: Avoid NSAIDs for 6-8 weeks after surgery   levothyroxine 137 MCG tablet Commonly known as: SYNTHROID Take 274 mcg by mouth daily before breakfast.   metoprolol succinate 50 MG 24 hr tablet Commonly known as: TOPROL-XL Take 50 mg by mouth daily. Notes to patient: Monitor Blood Pressure Daily and keep a log for primary care physician.  You may need to make changes to your medications with rapid weight loss.     multivitamin with minerals Tabs tablet Take 1 tablet by mouth daily.   ondansetron 4 MG disintegrating tablet Commonly known as: ZOFRAN-ODT Take 1 tablet (4 mg total) by mouth every 6 (six) hours as needed for nausea or vomiting.   oxyCODONE 5 MG immediate release tablet Commonly known as: Oxy IR/ROXICODONE Take 1 tablet (5 mg total) by mouth every 6 (six) hours as needed for severe  pain.   pantoprazole 40 MG tablet Commonly known as: PROTONIX Take 1 tablet (40 mg total) by mouth daily.   vitamin B-12 1000 MCG tablet Commonly known as: CYANOCOBALAMIN Take 1,000 mcg by mouth daily.   VITAMIN D3 PO Take 1 tablet by mouth daily.        Follow-up Information     Surgery, [place]. Go on 01/03/2021.   Specialty: General Surgery Why: at 10 am, Contact information: [address] [place] [address] [phone]         Carlena Hurl, PA-C. Go on 02/01/2021.   Specialty: General Surgery Why: at 845 am Contact information: [place] [address] [phone]                  Signed: Pedro Earls 12/13/2020, 1:17 PM

## 2020-12-13 NOTE — Plan of Care (Signed)
Pt was discharged home today. Instructions were reviewed with patient, and questions were answered. Pt was taken to main entrance via wheelchair by NT.  

## 2020-12-14 ENCOUNTER — Encounter: Payer: Self-pay | Admitting: *Deleted

## 2020-12-14 ENCOUNTER — Other Ambulatory Visit: Payer: Self-pay | Admitting: *Deleted

## 2020-12-14 DIAGNOSIS — I10 Essential (primary) hypertension: Secondary | ICD-10-CM

## 2020-12-14 NOTE — Patient Outreach (Signed)
[facility]) Care Management  12/14/2020  MARIESA GRIEDER October 08, 1981 [id number]   Transition of care call/case closure   Referral received:12/12/20 Initial outreach:12/14/20 Insurance: [employer]    Subjective: Initial successful telephone call to patient's preferred number in order to complete transition of care assessment; 2 HIPAA identifiers verified. Explained purpose of call and completed transition of care assessment.  Guneet states that she is feeling a lot better on today. She  denies post-operative problems, says surgical incisions are unremarkable, states surgical pain well managed with prescribed medications. She is tolerating full liquid diet, sipping on liquids progressing with protein and clear liquid intake, she denies having nausea on today experienced some on yesterday. She denies bowel or bladder problems, passing gas no bowel movement yet, aware of post discharge management of constipation if no bowel movement. Her Mother/friends and oldest child are assisting with her  recovery. 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 She reports tolerating mobility in the home.   Reviewed accessing the following [place] Benefits : She discussed history of hypertension and hopeful weight loss will help with management , says she  does not need a referral to one of the [place] chronic disease management programs. Reinforced monitoring blood pressure at home and notifying PCP of [place] for possible medication adjustment . She does  have the hospital indemnity plan, provided contact number to [employer] to file a claim if needed.  She  uses a [facility] outpatient pharmacy at [facility] , discussed [facility] mail order program .    Objective:  Melisse Caetano was hospitalized at [facility] for Laparoscopic gastric sleeve resection. Comorbidities include: hypertension, obesity  She was discharged to home on 12/13/20  without the need for home health services or DME.   Assessment:  Patient voices good understanding of all discharge instructions.  See transition of care flowsheet for assessment details.   Plan:  Reviewed hospital discharge diagnosis of Laparoscopic gastric sleeve resection   and discharge treatment plan using hospital discharge instructions, assessing medication adherence, reviewing problems requiring provider notification, and discussing the importance of follow up with surgeon, primary care provider and/or specialists as directed.  Reviewed [place] healthy lifestyle program information to receive discounted premium for  2023   Step 1: Get  your annual physical  Step 2: Complete your health assessment  Step 3:Identify your current health status and complete the corresponding action step between January 1, and August 16, 2021.    Marland Kitchen    No ongoing care management needs identified so will close case to [facility] Care Management services and route successful outreach letter with [facility] Care Management pamphlet and 24 Hour Nurse Line Magnet to [employer] Care Management clinical pool to be mailed to patient's home address.  Thanked patient for their services to [facility].  Egbert Garibaldi, RN, BSN  [facility] Care Management,Care Management Coordinator  [phone]- Mobile [phone]- Toll Free Main Office

## 2020-12-18 ENCOUNTER — Telehealth (HOSPITAL_COMMUNITY): Payer: Self-pay

## 2020-12-18 NOTE — Telephone Encounter (Signed)
Patient called to discuss post bariatric surgery follow up questions.  No answer voicemail with contact information provided for return call.  Will await patient response.   1.  Tell me about your pain and pain management?  2.  Let's talk about fluid intake.  How much total fluid are you taking in?  3.  How much protein have you taken in the last 2 days?  4.  Have you had nausea?  Tell me about when have experienced nausea and what you did to help?  5.  Has the frequency or color changed with your urine?  6.  Tell me what your incisions look like?  7.  Have you been passing gas? BM?  8.  If a problem or question were to arise who would you call?  Do you know contact numbers for [facility], [facility], and [facility]?  9.  How has the walking going?  10.  How are your vitamins and calcium going?  How are you taking them?

## 2020-12-26 ENCOUNTER — Other Ambulatory Visit: Payer: Self-pay

## 2020-12-26 ENCOUNTER — Encounter: Payer: 59 | Attending: Surgery | Admitting: Skilled Nursing Facility1

## 2020-12-26 DIAGNOSIS — E669 Obesity, unspecified: Secondary | ICD-10-CM | POA: Diagnosis not present

## 2020-12-27 NOTE — Progress Notes (Signed)
2 Week Post-Operative Nutrition Class   Patient was seen on 02/09/19 for Post-Operative Nutrition education at the [facility].    Surgery date: 12/12/2020 Surgery type: sleeve Start weight at [facility]: 331 Weight today: 268.7   Body Composition Scale 12/26/2020  Total Body Fat % 46.7  Visceral Fat 16  Fat-Free Mass % 53.2   Total Body Water % 41.1   Muscle-Mass lbs 32.4  Body Fat Displacement          Torso  lbs 77.8         Left Leg  lbs 15.5         Right Leg  lbs 15.5         Left Arm  lbs 7.7         Right Arm   lbs 7.7     The following the learning objectives were met by the patient during this course:  Identifies Phase 3 (Soft, High Proteins) Dietary Goals and will begin from 2 weeks post-operatively to 2 months post-operatively  Identifies appropriate sources of fluids and proteins   Identifies appropriate fat sources and healthy verses unhealthy fat types    States protein recommendations and appropriate sources post-operatively  Identifies the need for appropriate texture modifications, mastication, and bite sizes when consuming solids  Identifies appropriate multivitamin and calcium sources post-operatively  Describes the need for physical activity post-operatively and will follow MD recommendations  States when to call healthcare provider regarding medication questions or post-operative complications   Handouts given during class include:  Phase 3A: Soft, High Protein Diet Handout  Phase 3 High Protein Meals  Healthy Fats   Follow-Up Plan: Patient will follow-up at [facility] in 6 weeks for 2 month post-op nutrition visit for diet advancement per MD.

## 2021-01-01 ENCOUNTER — Telehealth: Payer: Self-pay | Admitting: Skilled Nursing Facility1

## 2021-01-01 NOTE — Telephone Encounter (Signed)
RD called pt to verify fluid intake once starting soft, solid proteins 2 week post-bariatric surgery.   Daily Fluid intake: 64 Daily Protein intake:60+  Concerns/issues:   None stated. Pt states she is working through increasing everything but doing well. Pt states he downloaded MyFitness pal which has really helped.

## 2021-02-06 ENCOUNTER — Encounter: Payer: 59 | Attending: Surgery | Admitting: Skilled Nursing Facility1

## 2021-02-06 ENCOUNTER — Other Ambulatory Visit: Payer: Self-pay

## 2021-02-06 DIAGNOSIS — E669 Obesity, unspecified: Secondary | ICD-10-CM | POA: Insufficient documentation

## 2021-02-06 NOTE — Progress Notes (Signed)
Bariatric Nutrition Follow-Up Visit Medical Nutrition Therapy   NUTRITION ASSESSMENT    Anthropometrics   Surgery date: 12/12/2020 Surgery type: sleeve Start weight at [facility]: 331 Weight today: 250.3   Body Composition Scale 12/26/2020 02/06/2021  Total Body Fat % 46.7 45  Visceral Fat 16 15  Fat-Free Mass % 53.2 54.9   Total Body Water % 41.1 41.9   Muscle-Mass lbs 32.4 32.2  Body Fat Displacement           Torso  lbs 77.8 69.8         Left Leg  lbs 15.5 13.9         Right Leg  lbs 15.5 13.9         Left Arm  lbs 7.7 6.9         Right Arm   lbs 7.7 6.9    Clinical  Medical hx: HTN Medications: Labs:    Lifestyle & Dietary Hx  Pt states she is realizing she needs to chew better and eat more frequently while at work. Pt states he has an hour drive to work. Pt states she sometimes forgets to eat.   Estimated daily fluid intake: 40-50 oz Estimated daily protein intake: 60+ g Supplements: multi and calcium (inconsisitant for 3) Current average weekly physical activity: 2-3 times a week 60 minutes  24-Hr Dietary Recall First Meal: 2 eggs or 1 egg + sausage Snack:  Second Meal: chicken + cheese stick Snack: cheese stick Third Meal: beef + cheese or tuna salad Snack:  Beverages: water, water + flavoring, decaf coffee  Post-Op Goals/ Signs/ Symptoms Using straws: no Drinking while eating: no Chewing/swallowing difficulties: no Changes in vision: no Changes to mood/headaches: no Hair loss/changes to skin/nails: no Difficulty focusing/concentrating: no Sweating: no Dizziness/lightheadedness: no Palpitations: no  Carbonated/caffeinated beverages: no N/V/D/C/Gas: every 2-3 days sometimes using mirilax Abdominal pain: no Dumping syndrome: no    NUTRITION DIAGNOSIS  Overweight/obesity (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   NUTRITION INTERVENTION Nutrition counseling (C-1) and education (E-2) to facilitate bariatric surgery goals, including: . Diet advancement to the next phase (phase 4) now including non starchy vegetables . The importance of consuming adequate calories as well as certain nutrients daily due to the body's need for essential vitamins, minerals, and fats . The importance of daily physical activity and to reach a goal of at least 150 minutes of moderate to vigorous physical activity weekly (or as directed by their physician) due to benefits such as increased musculature and improved lab values . The importance of intuitive eating specifically learning hunger-satiety cues and understanding the importance of learning a new body: The importance of mindful eating to avoid grazing behaviors   Goals: -Continue to aim for a minimum of 64 fluid ounces 7 days a week with at least 30 ounces being plain water -Eat non-starchy vegetables 2 times a day 7 days a week -Start out with soft cooked vegetables today and tomorrow; if tolerated begin to eat raw vegetables or cooked including salads -Eat your 3 ounces of protein first then start in on your non-starchy vegetables; once you understand how much of your meal leads to satisfaction and not full while still eating 3 ounces of protein and non-starchy vegetables you can eat them in any order  -Continue to aim for 30 minutes of activity at least 5 times a week -Do  NOT cook with/add to your food: alfredo sauce, cheese sauce, barbeque sauce, ketchup, fat back, butter, bacon grease, grease, Crisco, OR SUGAR  Handouts Provided Include   Phase 4   Learning Style & Readiness for Change Teaching method utilized: Visual & Auditory  Demonstrated degree of understanding via: Teach Back  Readiness Level: Action Barriers to learning/adherence to lifestyle change: none identified   RD's Notes for Next Visit . Assess adherence to pt chosen  goals   MONITORING & EVALUATION Dietary intake, weekly physical activity, body weight  Next Steps Patient is to follow-up in 3-4 months

## 2021-03-27 ENCOUNTER — Other Ambulatory Visit: Payer: Self-pay

## 2021-03-27 MED FILL — Bupropion HCl Tab ER 12HR 150 MG: ORAL | 30 days supply | Qty: 60 | Fill #0 | Status: AC

## 2021-04-18 ENCOUNTER — Other Ambulatory Visit: Payer: Self-pay

## 2021-04-18 MED FILL — Citalopram Hydrobromide Tab 40 MG (Base Equiv): ORAL | 90 days supply | Qty: 90 | Fill #0 | Status: AC

## 2021-04-24 ENCOUNTER — Other Ambulatory Visit: Payer: Self-pay

## 2021-04-24 ENCOUNTER — Encounter: Payer: 59 | Attending: Surgery | Admitting: Skilled Nursing Facility1

## 2021-04-24 DIAGNOSIS — E669 Obesity, unspecified: Secondary | ICD-10-CM | POA: Diagnosis not present

## 2021-04-26 NOTE — Progress Notes (Signed)
Follow-up visit:  Post-Operative  Surgery  Medical Nutrition Therapy:  Appt start time: 6:00pm end time:  7:00pm  Primary concerns today: Post-operative Bariatric Surgery Nutrition Management 6 Month Post-Op Class   Anthropometrics   Surgery date: 12/12/2020 Surgery type: sleeve Start weight at [facility]: 331 Weight today: 212   Body Composition Scale 12/26/2020 02/06/2021 04/26/2021  Total Body Fat % 46.7 45 40.6  Visceral Fat 16 15   Fat-Free Mass % 53.2 54.9    Total Body Water % 41.1 41.9 44.1   Muscle-Mass lbs 32.4 32.2   Body Fat Displacement            Torso  lbs 77.8 69.8          Left Leg  lbs 15.5 13.9          Right Leg  lbs 15.5 13.9          Left Arm  lbs 7.7 6.9          Right Arm   lbs 7.7 6.9       Information Reviewed/ Discussed During Appointment: -Review of composition scale numbers -Fluid requirements (64-100 ounces) -Protein requirements (60-80g) -Strategies for tolerating diet -Advancement of diet to include Starchy vegetables -Barriers to inclusion of new foods -Inclusion of appropriate multivitamin and calcium supplements  -Exercise recommendations   Fluid intake: adequate   Medications: See List Supplementation: appropriate    Using straws: no Drinking while eating: no Having you been chewing well: yes Chewing/swallowing difficulties: no Changes in vision: no Changes to mood/headaches: no Hair loss/Cahnges to skin/Changes to nails: no Any difficulty focusing or concentrating: no Sweating: no Dizziness/Lightheaded: no Palpitations: no  Carbonated beverages: no N/V/D/C/GAS: no Abdominal Pain: no Dumping syndrome: no  Recent physical activity:  ADL's  Progress Towards Goal(s):  In Progress Teaching method utilized: Visual & Auditory  Demonstrated degree of understanding via: Teach Back  Readiness Level: Action Barriers to learning/adherence to lifestyle change: none identified  Handouts given during visit include:  Phase V diet  Progression   Goals Sheet  The Benefits of Exercise are endless.....  Support Group Topics  Teaching Method Utilized:  Visual Auditory Hands on  Demonstrated degree of understanding via:  Teach Back   Monitoring/Evaluation:  Dietary intake, exercise, and body weight. Follow up in 3 months for 9 month post-op visit.

## 2021-05-16 ENCOUNTER — Ambulatory Visit (INDEPENDENT_AMBULATORY_CARE_PROVIDER_SITE_OTHER): Payer: 59 | Admitting: Obstetrics and Gynecology

## 2021-05-16 ENCOUNTER — Other Ambulatory Visit (HOSPITAL_COMMUNITY)
Admission: RE | Admit: 2021-05-16 | Discharge: 2021-05-16 | Disposition: A | Discharge status: Home / Self Care | Payer: 59 | Source: Ambulatory Visit | Attending: Obstetrics and Gynecology | Admitting: Obstetrics and Gynecology

## 2021-05-16 ENCOUNTER — Other Ambulatory Visit: Payer: Self-pay

## 2021-05-16 ENCOUNTER — Encounter: Payer: Self-pay | Admitting: Obstetrics and Gynecology

## 2021-05-16 VITALS — BP 120/86 | Ht 64.0 in | Wt 208.0 lb

## 2021-05-16 DIAGNOSIS — Z124 Encounter for screening for malignant neoplasm of cervix: Secondary | ICD-10-CM

## 2021-05-16 DIAGNOSIS — Z1231 Encounter for screening mammogram for malignant neoplasm of breast: Secondary | ICD-10-CM

## 2021-05-16 DIAGNOSIS — Z30430 Encounter for insertion of intrauterine contraceptive device: Secondary | ICD-10-CM | POA: Diagnosis not present

## 2021-05-16 DIAGNOSIS — Z01419 Encounter for gynecological examination (general) (routine) without abnormal findings: Secondary | ICD-10-CM | POA: Diagnosis not present

## 2021-05-16 DIAGNOSIS — Z1239 Encounter for other screening for malignant neoplasm of breast: Secondary | ICD-10-CM | POA: Diagnosis not present

## 2021-05-16 DIAGNOSIS — Z0000 Encounter for general adult medical examination without abnormal findings: Secondary | ICD-10-CM | POA: Diagnosis not present

## 2021-05-16 NOTE — Progress Notes (Signed)
Gynecology Annual Exam  PCP: Monico Blitz, MD  Chief Complaint:  Chief Complaint  Patient presents with  . Gynecologic Exam  . Contraception    Interested in IUD    History of Present Illness: Patient is a 40 y.o. G1W2993 presents for annual exam. The patient has no complaints today. The patient is interesting in [facility] and specifically requests information regarding IUD-LNG.   LMP: Patient's last menstrual period was 05/16/2021 (exact date). Average Interval: regular, 28-32 days Duration of flow: 4-6 days Heavy Menses: yes Clots: no Intermenstrual Bleeding: yes - light spotting Postcoital Bleeding: no Dysmenorrhea: no  The patient is sexually active. She currently uses none for contraception. However, she is requesting IUD-LNG placement today. She denies dyspareunia.  There is no notable family history of breast or ovarian cancer in her family.  The patient wears seatbelts: yes.   The patient has regular exercise: yes.  Currently going to the gym 2x weekly for about an hour. Reviewed current exercise guidelines.  The patient has a history of depression and anxiety. She reports both are well controlled on current medication regimen. Of note, the patient experienced the unexpected loss of her husband last year due to Covid-19 (delta variant) and reports the grief process has been challenging but that despite this she is currently coping well.  PQH-9: 2 GAD-7: 3  Review of Systems: Review of Systems  Constitutional: Negative.   HENT: Negative.   Eyes: Negative.   Respiratory: Negative.   Cardiovascular: Negative.   Gastrointestinal: Positive for constipation.  Genitourinary: Negative.   Musculoskeletal: Negative.   Skin: Negative.   Neurological: Negative.   Endo/Heme/Allergies: Negative.   Psychiatric/Behavioral: Negative.     Past Medical History:  Patient Active Problem List   Diagnosis Date Noted  . 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  Hypertension 09/29/2017  . 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 CESAREAN SECTION  2010  . CHOLECYSTECTOMY  2005  . LAPAROSCOPIC GASTRIC SLEEVE RESECTION N/A 12/12/2020   Procedure: LAPAROSCOPIC GASTRIC SLEEVE RESECTION;  Surgeon: Johnathan Hausen, MD;  Location: [facility];  Service: General;  Laterality: N/A;  . MASS EXCISION Right 11/24/2013   Procedure: EXCISION OF A RIGHT THYROID MASS;  Surgeon: Izora Gala, MD;  Location: [place];  Service: ENT;  Laterality: Right;  . NECK DISSECTION    . THYROIDECTOMY    . UPPER GI ENDOSCOPY N/A 12/12/2020   Procedure: UPPER GI ENDOSCOPY;  Surgeon: Johnathan Hausen, MD;  Location: [facility];  Service: General;  Laterality: N/A;    Gynecologic History:  Patient's last menstrual period was 05/16/2021 (exact date). Contraception: none Last Pap: 2019 Results were: NILM Last mammogram: Never - will order today  Obstetric History: G2P2002  Family History:  Family History  Problem Relation Age of Onset  . Colon cancer Maternal Grandmother     Social History:  Social History   Socioeconomic History  . Marital status: Widowed    Spouse name: Not on file  . Number of children: Not on file  . Years of education: Not on file  . Highest education level: Not on file  Occupational History  . Not on file  Tobacco Use  . Smoking status: Never Smoker  . Smokeless tobacco: Never Used  Vaping Use  . Vaping Use: Never used  Substance and Sexual Activity  . Alcohol use: Not Currently    Alcohol/week: 1.0 standard drink  Types: 1 Glasses of wine per week    Comment: occasional glass of wine  . Drug use: No  . Sexual activity: Yes    Birth control/protection: None  Other Topics Concern  . Not on file  Social History Narrative  . Not on file   Social Determinants of Health   Financial Resource Strain: Not on file  Food Insecurity: Not on file  Transportation  Needs: Not on file  Physical Activity: Not on file  Stress: Not on file  Social Connections: Not on file  Intimate Partner Violence: Not on file    Allergies:  Allergies  Allergen Reactions  . Sulfa Antibiotics Nausea And Vomiting  . Penicillins Rash    Medications: Prior to Admission medications   Medication Sig Start Date End Date Taking? Authorizing Provider  BIOTIN PO Take 1 tablet by mouth daily.   Yes [provider]  buPROPion [facility] SR) 150 MG 12 hr tablet TAKE 1 TABLET BY MOUTH TWICE DAILY 10/23/20 10/23/21 Yes Ralph Leyden, FNP  citalopram (CELEXA) 40 MG tablet TAKE 1 TABLET BY MOUTH DAILY 08/09/20 08/09/21 Yes Monico Blitz, MD  clonazePAM (KLONOPIN) 0.5 MG tablet Take 0.25 mg by mouth 2 (two) times daily as needed for anxiety.   Yes [provider]  levothyroxine (SYNTHROID) 137 MCG tablet TAKE 2 TABLETS BY MOUTH DAILY 10/02/20 10/02/21 Yes Annamarie Dawley, NP  Multiple Vitamin (MULTIVITAMIN WITH MINERALS) TABS tablet Take 1 tablet by mouth daily.   Yes [provider]  vitamin B-12 (CYANOCOBALAMIN) 1000 MCG tablet Take 1,000 mcg by mouth daily.   Yes [provider]  ibuprofen (ADVIL) 200 MG tablet Take 400-600 mg by mouth every 8 (eight) hours as needed (for pain.). Patient not taking: Reported on 05/16/2021    [provider]  metoprolol succinate (TOPROL-XL) 50 MG 24 hr tablet Take 50 mg by mouth daily. Patient not taking: Reported on 05/16/2021 08/09/20   [provider]  ondansetron (ZOFRAN-ODT) 4 MG disintegrating tablet Take 1 tablet (4 mg total) by mouth every 6 (six) hours as needed for nausea or vomiting. Patient not taking: Reported on 05/16/2021 12/13/20   Johnathan Hausen, MD    Physical Exam Vitals: Blood pressure 120/86, height 5\' 4"  (1.626 m), weight 208 lb (94.3 kg), last menstrual period 05/16/2021.  General: NAD HEENT: normocephalic, anicteric Thyroid: no enlargement, no palpable  nodules Pulmonary: No increased work of breathing, CTAB Cardiovascular: RRR, distal pulses 2+ Breast: Breast symmetrical, no tenderness, no palpable nodules or masses, no skin or nipple retraction present, no nipple discharge.  No axillary or supraclavicular lymphadenopathy. Abdomen: NABS, soft, non-tender, non-distended.  Umbilicus without lesions.  No hepatomegaly, splenomegaly or masses palpable. No evidence of hernia  Genitourinary:  External: Normal external female genitalia.  Normal urethral meatus, normal Bartholin's and Skene's glands.    Vagina: Normal vaginal mucosa, no evidence of prolapse.    Cervix: Grossly normal in appearance, no bleeding  Uterus: Non-enlarged, mobile, normal contour.  No CMT  Adnexa: ovaries non-enlarged, no adnexal masses  Rectal: deferred  Lymphatic: no evidence of inguinal lymphadenopathy Extremities: no edema, erythema, or tenderness Neurologic: Grossly intact Psychiatric: mood appropriate, affect full  IUD Insertion Procedure Note Patient identified, informed consent performed, consent signed.   Discussed risks of irregular bleeding, cramping, infection, malpositioning or misplacement of the IUD outside the uterus which may require further procedure such as laparoscopy, risk of failure <1%. Time out was performed.  Urine pregnancy test negative.  A bimanual exam showed  the uterus to be midposition.  Speculum placed in the vagina.  Cervix visualized.  Cleaned with Betadine x 2.  Grasped anteriorly with a single tooth tenaculum.  Uterus sounded to 7 cm.   Mirena IUD placed per manufacturer's recommendations.  Strings trimmed to 3 cm. Tenaculum was removed, good hemostasis noted.  Patient tolerated procedure well.   Assessment: 40 y.o. G2P2002 routine annual exam with IUD-LNG insertion.  Plan: Problem List Items Addressed This Visit   None   Visit Diagnoses    Encounter for gynecological examination without abnormal finding    -  Primary   Screening  for cervical cancer       Routine health maintenance       Screening mammogram for breast cancer       Screening breast examination          1) Mammogram - recommend yearly screening mammogram.  Mammogram Was ordered today   2) STI screening  wasoffered and declined  3) ASCCP guidelines and rational discussed.  Patient opts for every 5 years screening interval  4) Contraception - IUD-LNG was placed during today's visit. Patient was given post-procedure instructions.  She was advised to have backup contraception for one week.  Patient was also asked to check IUD strings periodically and follow up in 4 weeks for IUD check.  5) Colonoscopy -- Screening recommended starting at age 68 for average risk individuals, age 51 for individuals deemed at increased risk (including African Americans) and recommended to continue until age 51.  For patient age 87-85 individualized approach is recommended.  Gold standard screening is via colonoscopy, Cologuard screening is an acceptable alternative for patient unwilling or unable to undergo colonoscopy.  "Colorectal cancer screening for average?risk adults: 2018 guideline update from the American Cancer Society"CA: A Cancer Journal for Clinicians: May 14, 2017   6) Routine healthcare maintenance including cholesterol, diabetes screening discussed managed by PCP  7) Return in about 4 weeks (around 06/13/2021) for IUD string check.  Orlie Pollen, CNM, MSN [facility] 05/16/2021, 4:10 PM

## 2021-05-18 LAB — CYTOLOGY - PAP: Diagnosis: NEGATIVE

## 2021-05-28 ENCOUNTER — Other Ambulatory Visit: Payer: Self-pay

## 2021-05-28 MED FILL — Bupropion HCl Tab ER 12HR 150 MG: ORAL | 30 days supply | Qty: 60 | Fill #1 | Status: CN

## 2021-06-12 ENCOUNTER — Other Ambulatory Visit: Payer: Self-pay

## 2021-06-14 ENCOUNTER — Other Ambulatory Visit: Payer: Self-pay

## 2021-06-14 ENCOUNTER — Encounter: Payer: Self-pay | Admitting: Obstetrics

## 2021-06-14 ENCOUNTER — Ambulatory Visit: Payer: 59 | Admitting: Obstetrics

## 2021-06-14 VITALS — BP 116/70 | Ht 64.0 in | Wt 205.6 lb

## 2021-06-14 DIAGNOSIS — Z30431 Encounter for routine checking of intrauterine contraceptive device: Secondary | ICD-10-CM

## 2021-06-14 DIAGNOSIS — Z975 Presence of (intrauterine) contraceptive device: Secondary | ICD-10-CM

## 2021-06-14 NOTE — Progress Notes (Signed)
   IUD String Check  Subjctive: [name] presents for IUD string check.  She had a Mirena placed 4 weeks ago.  Since placement of her IUD she had minimal vaginal bleeding.  She denies cramping or discomfort.  She has not had intercourse since placement.  She has not checked the strings.  She denies any fever, chills, nausea, vomiting, or other complaints.    Objective: BP 116/70   Ht 5\' 4"  (1.626 m)   Wt 205 lb 9.6 oz (93.3 kg)   LMP 06/14/2021 (Exact Date)   BMI 35.29 kg/m  OBGyn Exam  Female chaperone was present for the entirety of the pelvic exam  Assessment: 40 y.o. year old female status post prior Mirena IUD placement 4 week sago, doing well.  Plan: 1.  The patient was given instructions to check her IUD strings monthly and call with any problems or concerns.  She should call for fevers, chills, abnormal vaginal discharge, pelvic pain, or other complaints. 2.  She will return for a annual exam in 1 year.  All questions answered.  20 minutes spent in face to face discussion with > 50% spent in counseling, management, and coordination of care for her newly-placed IUD.  Risks and benefits of IUD discussed including the risks of irregular bleeding, cramping, infection, malpositioning, expulsion, which may require further procedures such as laparoscopy.  IUDs while effective at preventing pregnancy do not prevent transmission of sexually transmitted diseases and use of barrier methods for this purpose was discussed.  Low overall incidence of failure with 99.7% efficacy rate in typical use.    Imagene Riches, CNM  06/14/2021 10:40 AM   06/14/2021 10:39 AM

## 2021-06-25 ENCOUNTER — Other Ambulatory Visit: Payer: Self-pay

## 2021-06-26 ENCOUNTER — Other Ambulatory Visit: Payer: Self-pay

## 2021-06-26 MED ORDER — LEVOTHYROXINE SODIUM 137 MCG PO TABS: 274.0000 ug | ORAL_TABLET | Freq: Every day | ORAL | 1 refills | Status: DC

## 2021-06-26 MED ORDER — LEVOTHYROXINE SODIUM 137 MCG PO TABS
274.0000 ug | ORAL_TABLET | Freq: Every day | ORAL | 1 refills | Status: AC
  Filled 2021-06-26: qty 180, 90d supply, fill #0
  Filled 2021-09-20: qty 180, 90d supply, fill #1

## 2021-06-27 ENCOUNTER — Other Ambulatory Visit: Payer: Self-pay

## 2021-06-27 MED FILL — Citalopram Hydrobromide Tab 40 MG (Base Equiv): ORAL | 90 days supply | Qty: 90 | Fill #1 | Status: CN

## 2021-06-29 ENCOUNTER — Other Ambulatory Visit: Payer: Self-pay

## 2021-07-10 ENCOUNTER — Other Ambulatory Visit: Payer: Self-pay

## 2021-07-10 DIAGNOSIS — Z299 Encounter for prophylactic measures, unspecified: Secondary | ICD-10-CM | POA: Diagnosis not present

## 2021-07-10 DIAGNOSIS — E039 Hypothyroidism, unspecified: Secondary | ICD-10-CM | POA: Diagnosis not present

## 2021-07-10 DIAGNOSIS — I10 Essential (primary) hypertension: Secondary | ICD-10-CM | POA: Diagnosis not present

## 2021-07-10 DIAGNOSIS — Z6834 Body mass index (BMI) 34.0-34.9, adult: Secondary | ICD-10-CM | POA: Diagnosis not present

## 2021-07-16 ENCOUNTER — Other Ambulatory Visit: Payer: Self-pay

## 2021-07-16 MED ORDER — LEVOTHYROXINE SODIUM 175 MCG PO TABS
175.0000 ug | ORAL_TABLET | Freq: Every day | ORAL | 1 refills | Status: DC
  Filled 2021-07-16: qty 90, 90d supply, fill #0
  Filled 2021-11-02: qty 90, 90d supply, fill #1

## 2021-07-17 ENCOUNTER — Other Ambulatory Visit: Payer: Self-pay

## 2021-07-17 MED FILL — Bupropion HCl Tab ER 12HR 150 MG: ORAL | 30 days supply | Qty: 60 | Fill #1 | Status: AC

## 2021-07-25 ENCOUNTER — Other Ambulatory Visit: Payer: Self-pay

## 2021-07-25 ENCOUNTER — Encounter: Payer: 59 | Attending: Surgery | Admitting: Skilled Nursing Facility1

## 2021-07-25 DIAGNOSIS — E669 Obesity, unspecified: Secondary | ICD-10-CM | POA: Insufficient documentation

## 2021-07-25 NOTE — Progress Notes (Signed)
Follow-up visit:  Post-Operative  Surgery  Medical Nutrition Therapy  Primary concerns today: Post-operative Bariatric Surgery Nutrition Management   Anthropometrics   Surgery date: 12/12/2020 Surgery type: sleeve Start weight at [facility]: 331 Weight today: 200.3 pounds   Body Composition Scale 12/26/2020 02/06/2021 04/26/2021 07/25/2021  Total Body Fat % 46.7 45 40.6 39  Visceral Fat '16 15  11  '$ Fat-Free Mass % 53.2 54.9  60.9   Total Body Water % 41.1 41.9 44.1 44.9   Muscle-Mass lbs 32.4 32.2  31.7  Body Fat Displacement             Torso  lbs 77.8 69.8  48.3         Left Leg  lbs 15.5 13.9  9.6         Right Leg  lbs 15.5 13.9  9.6         Left Arm  lbs 7.7 6.9  4.8         Right Arm   lbs 7.7 6.9  4.8   Pt states she is no longer taking her bp medication and her thyroid medication has changed.   Pt states her hair is thinning but she does have new growth.  Pt states she is still struggling to get her fluid in but is doing better and getting closer to that 64 ounce goal.  Pt states her energy is really good but does state she needs naps 20 minutes about 2 times a week.   24 hr recall: Breakfast: Eggs + bacon Snack: Lunch: Chicken or pork or beef + green beans or collard greens + potato or corn Snack: peppers + low fat ranch  Dinner: chicken salad   Beverage: water, sugar free gatorade   Fluid intake: 52-64 oz  Medications: See List Supplementation: appropriate    Using straws: no Drinking while eating: no Having you been chewing well: yes Chewing/swallowing difficulties: no Changes in vision: no Changes to mood/headaches: no Hair loss/Cahnges to skin/Changes to nails: hair loss Any difficulty focusing or concentrating: no Sweating: no Dizziness/Lightheaded: no Palpitations: no  Carbonated beverages: no N/V/D/C/GAS: no Abdominal Pain: no Dumping syndrome: no  Recent physical activity:  some walking or yard work  Progress Towards Goal(s):  In  Progress Teaching method utilized: Visual & Auditory  Demonstrated degree of understanding via: Teach Back  Readiness Level: Action Barriers to learning/adherence to lifestyle change: none identified  Goals: Add edemame to your salad Aim for 3 complex carbohydrates per day  Ensure you are eating enough throughout the day: 3 ounces is a minimum, 50 g is a minimum   Teaching Method Utilized:  Visual Auditory Hands on  Demonstrated degree of understanding via:  Teach Back   Monitoring/Evaluation:  Dietary intake, exercise, and body weight. Follow up in 3 months

## 2021-08-15 ENCOUNTER — Other Ambulatory Visit: Payer: Self-pay

## 2021-08-16 ENCOUNTER — Other Ambulatory Visit: Payer: Self-pay

## 2021-08-17 ENCOUNTER — Other Ambulatory Visit: Payer: Self-pay

## 2021-08-17 MED ORDER — CITALOPRAM HYDROBROMIDE 40 MG PO TABS
40.0000 mg | ORAL_TABLET | Freq: Every day | ORAL | 3 refills | Status: DC
  Filled 2021-08-17: qty 90, 90d supply, fill #0
  Filled 2021-11-02: qty 90, 90d supply, fill #1
  Filled 2022-04-17: qty 90, 90d supply, fill #2
  Filled 2022-07-30: qty 90, 90d supply, fill #3

## 2021-09-07 ENCOUNTER — Other Ambulatory Visit: Payer: Self-pay

## 2021-09-07 MED FILL — Bupropion HCl Tab ER 12HR 150 MG: ORAL | 30 days supply | Qty: 60 | Fill #2 | Status: AC

## 2021-09-20 ENCOUNTER — Other Ambulatory Visit: Payer: Self-pay

## 2021-10-01 ENCOUNTER — Other Ambulatory Visit: Payer: Self-pay

## 2021-10-11 ENCOUNTER — Other Ambulatory Visit: Payer: Self-pay

## 2021-10-11 ENCOUNTER — Encounter: Payer: 59 | Attending: Surgery | Admitting: Skilled Nursing Facility1

## 2021-10-11 DIAGNOSIS — E669 Obesity, unspecified: Secondary | ICD-10-CM | POA: Insufficient documentation

## 2021-10-11 NOTE — Progress Notes (Signed)
Follow-up visit:  Post-Operative  Surgery  Medical Nutrition Therapy  Primary concerns today: Post-operative Bariatric Surgery Nutrition Management   Anthropometrics   Surgery date: 12/12/2020 Surgery type: sleeve Start weight at [facility]: 331 Weight today: 201.3 pounds   Body Composition Scale 12/26/2020 02/06/2021 04/26/2021 07/25/2021 10/11/2021  Total Body Fat % 46.7 45 40.6 39 39.4  Visceral Fat 16 15  11 11   Fat-Free Mass % 53.2 54.9  60.9 60.5   Total Body Water % 41.1 41.9 44.1 44.9 44.7   Muscle-Mass lbs 32.4 32.2  31.7 31.4  Body Fat Displacement              Torso  lbs 77.8 69.8  48.3 49.1         Left Leg  lbs 15.5 13.9  9.6 9.8         Right Leg  lbs 15.5 13.9  9.6 9.8         Left Arm  lbs 7.7 6.9  4.8 4.9         Right Arm   lbs 7.7 6.9  4.8 4.9    Pt states her hair loss has gotten better. Pt states her energy level has gotten much better since eating more.  Pt states she works 12 hours shifts and tries to meal prep. Pt states she has been having issues with her son. Pt state she is aware of her emotional eating and has been avoiding that behavior. Pt states she does have an appt with a counselor upcoming.  Pt states she is trying to be active but with her son it has slowed a bit.  Pt states she is happy where she is at but does want to lose 10 pounds.   24 hr recall: Breakfast: Eggs + bacon or yogurt + protein granola + egg Snack: half apple + peanut butter Lunch: Chicken or pork or beef + green beans or collard greens  Snack: peppers + low fat ranch or half apple + peanut butter Dinner: Chicken or pork or beef + green beans or collard greens   Beverage: water, sugar free gatorade   Fluid intake: 52-64 oz  Medications: See List Supplementation: appropriate    Using straws: no Drinking while eating: no Having you been chewing well: yes Chewing/swallowing difficulties: no Changes in vision: no Changes to mood/headaches: no Hair loss/Cahnges to  skin/Changes to nails: hair loss: has gotten better Any difficulty focusing or concentrating: no Sweating: no Dizziness/Lightheaded: no Palpitations: no  Carbonated beverages: no N/V/D/C/GAS: no Abdominal Pain: no Dumping syndrome: no  Recent physical activity:  some walking or yard work  Progress Towards Goal(s):  In Progress Teaching method utilized: Environmental health practitioner & Auditory  Demonstrated degree of understanding via: Teach Back  Readiness Level: Action Barriers to learning/adherence to lifestyle change: none identified   Teaching Method Utilized:  Visual Auditory Hands on  Demonstrated degree of understanding via:  Teach Back   Monitoring/Evaluation:  Dietary intake, exercise, and body weight. Follow up in February

## 2021-10-18 ENCOUNTER — Other Ambulatory Visit: Payer: Self-pay

## 2021-11-02 ENCOUNTER — Other Ambulatory Visit: Payer: Self-pay

## 2021-11-02 MED ORDER — BUPROPION HCL ER (SR) 150 MG PO TB12
ORAL_TABLET | ORAL | 11 refills | Status: AC
  Filled 2021-11-02: qty 60, 30d supply, fill #0
  Filled 2022-01-27: qty 60, 30d supply, fill #1
  Filled 2022-04-17: qty 60, 30d supply, fill #2
  Filled 2022-06-07: qty 60, 30d supply, fill #3
  Filled 2022-09-04: qty 60, 30d supply, fill #4
  Filled 2022-10-16: qty 60, 30d supply, fill #5

## 2021-11-06 ENCOUNTER — Ambulatory Visit (INDEPENDENT_AMBULATORY_CARE_PROVIDER_SITE_OTHER): Payer: 59 | Admitting: Psychology

## 2021-11-06 ENCOUNTER — Other Ambulatory Visit: Payer: Self-pay

## 2021-11-06 DIAGNOSIS — F3289 Other specified depressive episodes: Secondary | ICD-10-CM

## 2021-11-06 MED ORDER — BUPROPION HCL ER (SR) 150 MG PO TB12
ORAL_TABLET | ORAL | 11 refills | Status: DC
  Filled 2021-11-06 – 2022-10-16 (×2): qty 60, 30d supply, fill #0

## 2021-11-20 ENCOUNTER — Other Ambulatory Visit: Payer: Self-pay

## 2021-11-20 ENCOUNTER — Ambulatory Visit (INDEPENDENT_AMBULATORY_CARE_PROVIDER_SITE_OTHER): Payer: 59 | Admitting: Psychology

## 2021-11-20 DIAGNOSIS — F3289 Other specified depressive episodes: Secondary | ICD-10-CM | POA: Diagnosis not present

## 2021-11-20 DIAGNOSIS — F32A Depression, unspecified: Secondary | ICD-10-CM | POA: Insufficient documentation

## 2021-11-20 DIAGNOSIS — F4323 Adjustment disorder with mixed anxiety and depressed mood: Secondary | ICD-10-CM | POA: Insufficient documentation

## 2021-11-20 NOTE — Progress Notes (Signed)
[place] Counselor/Therapist Progress Note  Patient ID: JONEL SICK, MRN: [id number].    Date: 11/19/2021  Time Spent: 9:37 am - 10:29 am: 52 minutes  Treatment Type: Individual Therapy  Reported Symptoms: Grief, Anxiety, sadness.   Mental Status Exam: Appearance:  Neat and Well Groomed     Behavior: Appropriate  Motor: Normal  Speech/Language:  Clear and Coherent and Normal Rate  Affect: Congruent  Mood: anxious  Thought process: normal  Thought content:   WNL  Sensory/Perceptual disturbances:   WNL  Orientation: oriented to person, place, time/date, and situation  Attention: Good  Concentration: Good  Memory: WNL  Fund of knowledge:  Good  Insight:   Good  Judgment:  Good  Impulse Control: Good   Risk Assessment: Danger to Self:  No Self-injurious Behavior: No Danger to Others: No Duty to Warn:no Physical Aggression / Violence:No  Access to Firearms a concern: No  Gang Involvement:No   Subjective:   Karlton Lemon Upadhyay participated in the session, in person in the office with the therapist, and consented to treatment [place] reviewed the events of the past week. Qamar endorsed symptoms of grief, cognitive distortions including jumping to conclusions, and feeling like a failure in regards to parenthood.  She noted her son recently dropping out from high school at the age of 55 and noted thoughts of self blame regarding this decision by him.  She provided history regarding his academic performance, which was poor, combined with a lack of interest in attending school on a consistent basis.  We explored this during the session along with her feelings regarding her son's decision making and worked on identifying ways to manage these feelings and provide structure and accountability for her son outside of the academic setting.  Therapist encouraged Ayjah to identify her goals her goals for her son going forward.  We discussed our intent to review positive  and assertive communication to facilitate this conversation with her son in the future.  Additionally, Jamesina noted the pressures of single-parent home since her husband's passing to a coma.  She noted feeling like a failure. We worked on identifying her feelings since the loss.  She then discussed feelings of loneliness and noted that her marriage, during the past 2 years, had been strained.  We began processing this as well and therapist encouraged Jahanna to create a list of characteristics in the future partner, along with "deal breakers", to be processed during our follow-up appointment as well.  Psychoeducation regarding loss and grief was provided during the session and therapist encouraged Chamaine to identify details regarding the self-imposed timeline regarding grief.  Therapist validated she denies experiencing feelings, provided psychoeducation, and provided supportive therapy.  Therapist encouraged Cherae to journal but noted the importance of creating boundaries regarding the duration of journaling and the frequency.  Shaune was engaged and motivated during the session and expressed her interest in commitment towards addressing our previously identified therapeutic goals.  Interventions: Grief Therapy and Interpersonal  Diagnosis: Adjustment disorder with mixed anxiety and depressed mood   Plan: Patient is to use CBT, BA, problem solving,  mindfulness and coping skills to help manage decrease symptoms associated with their diagnosis.   Long-term goal:   Reduce overall level, frequency, and intensity of the feelings of depression and anxiety evidenced by decreased sadness, worry, negative self talk, and loss of from 6 to 7 days/week to 0 to 1 days/week per client report for at least 3 consecutive months.  Short-term goal:  Verbally express understanding of the relationship between feelings of depression, anxiety and their impact on thinking patterns and behaviors. Verbalize an  understanding of the role that distorted thinking plays in creating fears, excessive worry, and ruminations.  [name], LCSW

## 2021-12-12 ENCOUNTER — Other Ambulatory Visit: Payer: Self-pay

## 2021-12-12 MED ORDER — CARESTART COVID-19 HOME TEST VI KIT
PACK | 0 refills | Status: AC
  Filled 2021-12-12: qty 2, 4d supply, fill #0

## 2021-12-21 DIAGNOSIS — N390 Urinary tract infection, site not specified: Secondary | ICD-10-CM | POA: Diagnosis not present

## 2021-12-21 DIAGNOSIS — Z299 Encounter for prophylactic measures, unspecified: Secondary | ICD-10-CM | POA: Diagnosis not present

## 2021-12-25 ENCOUNTER — Other Ambulatory Visit: Payer: Self-pay

## 2021-12-25 ENCOUNTER — Ambulatory Visit (INDEPENDENT_AMBULATORY_CARE_PROVIDER_SITE_OTHER): Payer: 59 | Admitting: Psychology

## 2021-12-25 DIAGNOSIS — F3289 Other specified depressive episodes: Secondary | ICD-10-CM | POA: Diagnosis not present

## 2021-12-25 NOTE — Progress Notes (Signed)
[facility] Counselor/Therapist Progress Note  Patient ID: KATIA HANNEN, MRN: [id number].    Date: 12/25/2021  Time Spent: 8:03 am - 8:50 am: 47 minutes  Treatment Type: Individual Therapy  Reported Symptoms: Grief, Anxiety, sadness.   Mental Status Exam: Appearance:  Neat and Well Groomed     Behavior: Appropriate  Motor: Normal  Speech/Language:  Clear and Coherent and Normal Rate  Affect: Congruent  Mood: anxious  Thought process: normal  Thought content:   WNL  Sensory/Perceptual disturbances:   WNL  Orientation: oriented to person, place, time/date, and situation  Attention: Good  Concentration: Good  Memory: WNL  Fund of knowledge:  Good  Insight:   Good  Judgment:  Good  Impulse Control: Good   Risk Assessment: Danger to Self:  No Self-injurious Behavior: No Danger to Others: No Duty to Warn:no Physical Aggression / Violence:No  Access to Firearms a concern: No  Gang Involvement:No   Subjective:   Karlton Lemon Juneau participated in the session, in person in the office with the therapist, and consented to treatment [place] reviewed the events of the past week. Issabela noted her grandmother's recent diagnosis of advanced cancer and her decision not seek treatment due to her age. Iyani noted her sadness and worry regarding her grandmother's passing. We processed her feelings during the session. Elodie discussed difficulty communicating with her son due to his perception of her being directive and "fussing". We explored this dynamic during the session. Cella noted her son's mental health concerns including ADHD and ODD, which she noted was difficult to manage, at times.  Flonnie discussed her son's lack of motivation and energy in the household, which she was concerned about.  We reviewed assertive and clear communication, the use of empathy, and communicating consistent expectations.  Therapist modeled this during the session.  We worked on  processing her feelings regarding the loss of her husband in regards to parenting.  Cheryllynn was engaged and motivated during the session and expressed commitment towards her goals.  Therapist praised him for her effort and energy during the session, and between sessions and provided supportive therapy.  Interventions: Grief Therapy and Interpersonal  Diagnosis: Other depression   Plan: Patient is to use CBT, BA, problem solving,  mindfulness and coping skills to help manage decrease symptoms associated with their diagnosis. (Target date: 11/06/22)  Long-term goal:   Reduce overall level, frequency, and intensity of the feelings of depression and anxiety evidenced by decreased sadness, worry, negative self talk, and loss of from 6 to 7 days/week to 0 to 1 days/week per client report for at least 3 consecutive months.  Short-term goal:  Verbally express understanding of the relationship between feelings of depression, anxiety and their impact on thinking patterns and behaviors. Verbalize an understanding of the role that distorted thinking plays in creating fears, excessive worry, and ruminations.  [name], LCSW

## 2021-12-28 DIAGNOSIS — Z299 Encounter for prophylactic measures, unspecified: Secondary | ICD-10-CM | POA: Diagnosis not present

## 2021-12-28 DIAGNOSIS — I10 Essential (primary) hypertension: Secondary | ICD-10-CM | POA: Diagnosis not present

## 2021-12-28 DIAGNOSIS — N390 Urinary tract infection, site not specified: Secondary | ICD-10-CM | POA: Diagnosis not present

## 2022-01-08 ENCOUNTER — Other Ambulatory Visit: Payer: Self-pay

## 2022-01-08 ENCOUNTER — Ambulatory Visit (INDEPENDENT_AMBULATORY_CARE_PROVIDER_SITE_OTHER): Payer: 59 | Admitting: Psychology

## 2022-01-08 DIAGNOSIS — F3289 Other specified depressive episodes: Secondary | ICD-10-CM

## 2022-01-08 NOTE — Progress Notes (Signed)
[facility] Counselor/Therapist Progress Note  Patient ID: EMMANUEL ERCOLE, MRN: [id number].    Date: 01/08/2022  Time Spent: 8:04 am - 8:48 am: 44 minutes  Treatment Type: Individual Therapy  Reported Symptoms: Grief, Anxiety, sadness.   Mental Status Exam: Appearance:  Neat and Well Groomed     Behavior: Appropriate  Motor: Normal  Speech/Language:  Clear and Coherent and Normal Rate  Affect: Congruent  Mood: normal  Thought process: normal  Thought content:   WNL  Sensory/Perceptual disturbances:   WNL  Orientation: oriented to person, place, time/date, and situation  Attention: Good  Concentration: Good  Memory: WNL  Fund of knowledge:  Good  Insight:   Good  Judgment:  Good  Impulse Control: Good   Risk Assessment: Danger to Self:  No Self-injurious Behavior: No Danger to Others: No Duty to Warn:no Physical Aggression / Violence:No  Access to Firearms a concern: No  Gang Involvement:No   Subjective:   Karlton Lemon Perret participated in the session, in person in the office with the therapist, and consented to treatment [place] reviewed the events of the past week. Nnenna noted some improvement in her son's effort and outlook, overall. She noted her attempts to approach discussions and concerns in a calm manner, which she has found to be effective. She noted worry about her grandmother's eventual passing and her son's reaction to this. Nalanie noted her grandmother being a primary caretaker for Therisa's son while Omaria worked. Lauretta discussed her own possible grief, as well. We explored this during the session. She discussed her own grief, for her husband, who passed from [place] unexpectedly. She discussed the stressors of being a single parent and being in charge of all the financial and logistics, day-to-day. We worked on reviewing coping skills during the session and Chicquita identified a need to exercise more consistently. She noted her intent to to  work out 2x per week and focus on exercising during her off-days from work. We employed BA principles to set a schedule, create reasonable expectations, prepare for task ahead of time, and identify possible barriers to engagement. Jenicka was engaged and motivated during the session and expressed commitment towards her goals.  Therapist praised him for her effort and energy during the session, and between sessions and provided supportive therapy.  Interventions: Grief Therapy and Interpersonal  Diagnosis: Other depression   Plan: Patient is to use CBT, BA, problem solving,  mindfulness and coping skills to help manage decrease symptoms associated with their diagnosis. (Target date: 11/06/22)  Long-term goal:   Reduce overall level, frequency, and intensity of the feelings of depression and anxiety evidenced by decreased sadness, worry, negative self talk, and loss of from 6 to 7 days/week to 0 to 1 days/week per client report for at least 3 consecutive months.  Short-term goal:  Verbally express understanding of the relationship between feelings of depression, anxiety and their impact on thinking patterns and behaviors. Verbalize an understanding of the role that distorted thinking plays in creating fears, excessive worry, and ruminations. Increase frequency of Self-care   [name], LCSW

## 2022-01-22 ENCOUNTER — Ambulatory Visit (INDEPENDENT_AMBULATORY_CARE_PROVIDER_SITE_OTHER): Payer: 59 | Admitting: Psychology

## 2022-01-22 ENCOUNTER — Other Ambulatory Visit: Payer: Self-pay

## 2022-01-22 DIAGNOSIS — F3289 Other specified depressive episodes: Secondary | ICD-10-CM | POA: Diagnosis not present

## 2022-01-22 NOTE — Progress Notes (Signed)
[facility] Counselor/Therapist Progress Note  Patient ID: Sabrina [name], MRN: [id number].    Date: 01/22/2022  Time Spent: 8:02 am - 8:52 am: 50 minutes  Treatment Type: Individual Therapy  Reported Symptoms: Grief, Anxiety, sadness.   Mental Status Exam: Appearance:  Neat and Well Groomed     Behavior: Appropriate  Motor: Normal  Speech/Language:  Clear and Coherent and Normal Rate  Affect: Congruent  Mood: normal  Thought process: normal  Thought content:   WNL  Sensory/Perceptual disturbances:   WNL  Orientation: oriented to person, place, time/date, and situation  Attention: Good  Concentration: Good  Memory: WNL  Fund of knowledge:  Good  Insight:   Good  Judgment:  Good  Impulse Control: Good   Risk Assessment: Danger to Self:  No Self-injurious Behavior: No Danger to Others: No Duty to Warn:no Physical Aggression / Violence:No  Access to Firearms a concern: No  Gang Involvement:No   Subjective:   Sabrina [name] participated in the session, in person in the office with the therapist, and consented to treatment Sabrina [name] reviewed the events of the past week. Sabrina [name] noted some improvement in her son's overall symptoms. Sabrina [name] not significant concern regarding her son's recent A1C due to his unmanaged  type-1 diabetes. Sabrina [name] endorsed getting quality sleep on a nightly basis. She continues to find it difficult to get exercise due to numerous including distance for gym, work schedule, and motivation. We continued to explore this, during the session, and worked on identifying ways to address the current barriers. We reviewed the use of reinforcers with an overview to [facility]. Therapist encouraged Sabrina [name] to identify positive and healthful reinforcers. Sabrina [name] was engaged and motivated during the session and expressed commitment towards her goals.  Therapist praised her for her effort and energy during the session, and between sessions and provided  supportive therapy.  Interventions: Grief Therapy and Interpersonal  Diagnosis: Other depression   Plan: Patient is to use CBT, BA, problem solving,  mindfulness and coping skills to help manage decrease symptoms associated with their diagnosis. (Target date: 11/06/22)  Long-term goal:   Reduce overall level, frequency, and intensity of the feelings of depression and anxiety evidenced by decreased sadness, worry, negative self talk, and loss of from 6 to 7 days/week to 0 to 1 days/week per client report for at least 3 consecutive months.  Short-term goal:  Verbally express understanding of the relationship between feelings of depression, anxiety and their impact on thinking patterns and behaviors. Verbalize an understanding of the role that distorted thinking plays in creating fears, excessive worry, and ruminations. Increase frequency of Self-care  Sabrina Irish, LCSW

## 2022-01-24 ENCOUNTER — Encounter: Payer: 59 | Attending: Surgery | Admitting: Skilled Nursing Facility1

## 2022-01-24 ENCOUNTER — Other Ambulatory Visit: Payer: Self-pay

## 2022-01-24 DIAGNOSIS — E669 Obesity, unspecified: Secondary | ICD-10-CM | POA: Diagnosis not present

## 2022-01-24 NOTE — Progress Notes (Signed)
Follow-up visit:  Post-Operative  Surgery  Medical Nutrition Therapy  Primary concerns today: Post-operative Bariatric Surgery Nutrition Management   Anthropometrics   Surgery date: 12/12/2020 Surgery type: sleeve Start weight at [facility]: 331 Weight today: 214.8 pounds   Body Composition Scale 12/26/2020 02/06/2021 04/26/2021 07/25/2021 10/11/2021 01/24/2022  Total Body Fat % 46.7 45 40.6 39 39.4 41.1  Visceral Fat 16 15  11 11 12   Fat-Free Mass % 53.2 54.9  60.9 60.5 58.8   Total Body Water % 41.1 41.9 44.1 44.9 44.7 43.9   Muscle-Mass lbs 32.4 32.2  31.7 31.4 31.7  Body Fat Displacement               Torso  lbs 77.8 69.8  48.3 49.1 54.7         Left Leg  lbs 15.5 13.9  9.6 9.8 10.9         Right Leg  lbs 15.5 13.9  9.6 9.8 10.9         Left Arm  lbs 7.7 6.9  4.8 4.9 5.4         Right Arm   lbs 7.7 6.9  4.8 4.9 5.4     Pt states she needs to follow a diet and know what macros she needs. Pt states her bowels have been a little harder needing some stool softener here and there.  Pt states she works with a talk therapist now.  Pt states she has been working on being mindful with her food choices.   24 hr recall: Breakfast: 2 eggs + yogurt + granola Snack:  Lunch: 2 boiled eggs + 2 wedges cheese Snack: yogurt Dinner: eaten out   Beverage: water, sugar free gatorade, water + flavorings, caoffee + splenda + zero suagr creamer, unsweet tea + splenda   Fluid intake: 52-64 oz  Medications: See List; some changes with thyroid medication  Supplementation: multi and calcium   Using straws: no Drinking while eating: no Having you been chewing well: yes Chewing/swallowing difficulties: no Changes in vision: no Changes to mood/headaches: no Hair loss/Cahnges to skin/Changes to nails: hair loss: has gotten better Any difficulty focusing or concentrating: no Sweating: no Dizziness/Lightheaded: no Palpitations: no  Carbonated beverages: no N/V/D/C/GAS: no Abdominal Pain:  no Dumping syndrome: no  Recent physical activity:  some walking or yard work  Progress Towards Goal(s):  In Progress Teaching method utilized: Visual & Auditory  Demonstrated degree of understanding via: Teach Back  Readiness Level: Action Barriers to learning/adherence to lifestyle change: none identified  Education topics: Encouraged patient to honor their body's internal hunger and fullness cues.  Throughout the day, check in mentally and rate hunger. Stop eating when satisfied not full regardless of how much food is left on the plate.  Get more if still hungry 20-30 minutes later.  The key is to honor satisfaction so throughout the meal, rate fullness factor and stop when comfortably satisfied not physically full. The key is to honor hunger and fullness without any feelings of guilt or shame.  Pay attention to what the internal cues are, rather than any external factors. This will enhance the confidence you have in listening to your own body and following those internal cues enabling you to increase how often you eat when you are hungry not out of appetite and stop when you are satisfied not full.  Encouraged pt to continue to eat balanced meals inclusive of non starchy vegetables 2 times a day 7 days a week Encouraged pt to  choose lean protein sources: limiting beef, pork, sausage, hotdogs, and lunch meat Encourage pt to choose healthy fats such as plant based limiting animal fats Encouraged pt to continue to drink a minium 64 fluid ounces with half being plain water to satisfy proper hydration  Why physical activity is needed:  Physical activity has various benefits for the body such as reducing stress, helping control blood glucose levels, strengthening the heart, reducing incidence of cognitive impairment, and has a neuroprotective effect on memory function. Research reports that physical activity (whether it be aerobic, resistance training, or high intensity interval training) help control  the level of reactive oxygen species which can lead to oxidative stress on the brain therefor possibly impairing memory, learning abilities, and other cognitive functions. The CDC recognizes that physical activity can help prevent premature death, may prevent the development of type 2 diabetes, different cancers, and heart disease. Not only are there are health benefits to physical activity, the CDC also reports that physical activity would decrease health care costs, increase property values, and people who are physically active generally have less sick days. Resource https://www-sciencedirect-com.libproxy.http://www.castillo-fisher.org/, CDC   Handouts: Meal ideas sheet  Goals: -32 ounces plain water and 32 ounces anything else with no sugar -have a snack  such as yogurt or a clementine after work to help take the edge off until you get to dinner to help reduce eating out -create balanced meals inclusive of non starchy vegetables 7 days a week -avoid or limit calorically dense foods to monthly or less but do not compensate with your nutrient dense foods -you have done nothing you cannot undo, you body is resilient, you have been through a lot emotionally if you are aware and making mindful decisions it will never be the wrong decision   Teaching Method Utilized:  Visual Auditory Hands on  Demonstrated degree of understanding via:  Teach Back   Monitoring/Evaluation:  Dietary intake, exercise, and body weight.

## 2022-01-27 ENCOUNTER — Other Ambulatory Visit: Payer: Self-pay

## 2022-01-28 ENCOUNTER — Other Ambulatory Visit: Payer: Self-pay

## 2022-01-28 MED ORDER — LEVOTHYROXINE SODIUM 175 MCG PO TABS
175.0000 ug | ORAL_TABLET | Freq: Every day | ORAL | 1 refills | Status: DC
  Filled 2022-01-28: qty 90, 90d supply, fill #0
  Filled 2022-06-07: qty 30, 30d supply, fill #1
  Filled 2022-07-30: qty 30, 30d supply, fill #2
  Filled 2022-09-04: qty 30, 30d supply, fill #3

## 2022-01-29 ENCOUNTER — Other Ambulatory Visit: Payer: Self-pay

## 2022-02-05 ENCOUNTER — Ambulatory Visit: Payer: 59 | Admitting: Psychology

## 2022-02-26 ENCOUNTER — Ambulatory Visit: Payer: 59 | Admitting: Psychology

## 2022-04-17 ENCOUNTER — Other Ambulatory Visit: Payer: Self-pay

## 2022-06-07 ENCOUNTER — Other Ambulatory Visit: Payer: Self-pay

## 2022-06-12 ENCOUNTER — Other Ambulatory Visit: Payer: Self-pay

## 2022-06-13 ENCOUNTER — Other Ambulatory Visit: Payer: Self-pay

## 2022-07-30 ENCOUNTER — Other Ambulatory Visit: Payer: Self-pay

## 2022-07-31 ENCOUNTER — Other Ambulatory Visit: Payer: Self-pay

## 2022-09-04 ENCOUNTER — Other Ambulatory Visit: Payer: Self-pay

## 2022-10-15 ENCOUNTER — Other Ambulatory Visit: Payer: Self-pay

## 2022-10-16 ENCOUNTER — Other Ambulatory Visit: Payer: Self-pay

## 2022-10-17 ENCOUNTER — Other Ambulatory Visit: Payer: Self-pay

## 2022-10-18 ENCOUNTER — Other Ambulatory Visit: Payer: Self-pay

## 2022-10-18 MED ORDER — LEVOTHYROXINE SODIUM 175 MCG PO TABS
175.0000 ug | ORAL_TABLET | Freq: Every day | ORAL | 1 refills | Status: DC
  Filled 2023-05-26: qty 90, 90d supply, fill #0
  Filled 2023-09-07: qty 90, 90d supply, fill #1

## 2022-10-18 MED ORDER — LEVOTHYROXINE SODIUM 175 MCG PO TABS
175.0000 ug | ORAL_TABLET | Freq: Every day | ORAL | 1 refills | Status: AC
  Filled 2022-10-18: qty 90, 90d supply, fill #0
  Filled 2023-01-24: qty 90, 90d supply, fill #1

## 2022-11-14 ENCOUNTER — Other Ambulatory Visit: Payer: Self-pay

## 2022-11-14 MED ORDER — CITALOPRAM HYDROBROMIDE 40 MG PO TABS
40.0000 mg | ORAL_TABLET | Freq: Every day | ORAL | 3 refills | Status: DC
  Filled 2022-11-14: qty 90, 90d supply, fill #0
  Filled 2023-03-05: qty 90, 90d supply, fill #1
  Filled 2023-05-29: qty 90, 90d supply, fill #2
  Filled 2023-09-07: qty 90, 90d supply, fill #3

## 2022-12-25 ENCOUNTER — Other Ambulatory Visit: Payer: Self-pay

## 2022-12-26 ENCOUNTER — Other Ambulatory Visit: Payer: Self-pay

## 2022-12-26 MED ORDER — BUPROPION HCL ER (SR) 150 MG PO TB12
150.0000 mg | ORAL_TABLET | Freq: Two times a day (BID) | ORAL | 11 refills | Status: DC
  Filled 2022-12-26 – 2023-01-17 (×2): qty 60, 30d supply, fill #0
  Filled 2023-04-02: qty 60, 30d supply, fill #1
  Filled 2023-05-29: qty 60, 30d supply, fill #2
  Filled 2023-06-23 – 2023-07-17 (×2): qty 60, 30d supply, fill #3
  Filled 2023-09-07: qty 60, 30d supply, fill #4
  Filled 2023-11-03: qty 60, 30d supply, fill #5

## 2022-12-27 ENCOUNTER — Other Ambulatory Visit
Admission: RE | Admit: 2022-12-27 | Discharge: 2022-12-27 | Disposition: A | Discharge status: Home / Self Care | Payer: Commercial Managed Care - PPO | Attending: Emergency Medicine | Admitting: Emergency Medicine

## 2022-12-27 DIAGNOSIS — E669 Obesity, unspecified: Secondary | ICD-10-CM | POA: Insufficient documentation

## 2022-12-27 LAB — LIPID PANEL
Cholesterol: 204 mg/dL — ABNORMAL HIGH (ref 0–200)
HDL: 61 mg/dL (ref >40)
LDL Cholesterol: 122 mg/dL — ABNORMAL HIGH (ref 0–99)
Total CHOL/HDL Ratio: 3.3 RATIO
Triglycerides: 106 mg/dL (ref <150)
VLDL: 21 mg/dL (ref 0–40)

## 2022-12-27 LAB — COMPREHENSIVE METABOLIC PANEL
ALT: 18 U/L (ref 0–44)
AST: 24 U/L (ref 15–41)
Albumin: 4.2 g/dL (ref 3.5–5.0)
Alkaline Phosphatase: 44 U/L (ref 38–126)
Anion gap: 10 (ref 5–15)
BUN: 13 mg/dL (ref 6–20)
CO2: 20 mmol/L — ABNORMAL LOW (ref 22–32)
Calcium: 8.6 mg/dL — ABNORMAL LOW (ref 8.9–10.3)
Chloride: 105 mmol/L (ref 98–111)
Creatinine, Ser: 0.68 mg/dL (ref 0.44–1.00)
GFR, Estimated: >60 mL/min (ref >60)
Glucose, Bld: 125 mg/dL — ABNORMAL HIGH (ref 70–99)
Potassium: 3.7 mmol/L (ref 3.5–5.1)
Sodium: 135 mmol/L (ref 135–145)
Total Bilirubin: 0.6 mg/dL (ref 0.3–1.2)
Total Protein: 7.2 g/dL (ref 6.5–8.1)

## 2022-12-27 LAB — TSH: TSH: 0.584 u[IU]/mL (ref 0.350–4.500)

## 2022-12-27 LAB — HEMOGLOBIN A1C
Hgb A1c MFr Bld: 5.1 % (ref 4.8–5.6)
Mean Plasma Glucose: 99.67 mg/dL

## 2023-01-10 ENCOUNTER — Other Ambulatory Visit: Payer: Self-pay

## 2023-01-16 ENCOUNTER — Other Ambulatory Visit: Payer: Self-pay

## 2023-01-17 ENCOUNTER — Other Ambulatory Visit: Payer: Self-pay

## 2023-01-17 MED ORDER — WEGOVY 0.25 MG/0.5ML ~~LOC~~ SOAJ
SUBCUTANEOUS | 0 refills | Status: DC
  Filled 2023-01-17: qty 2, 28d supply, fill #0

## 2023-01-24 ENCOUNTER — Other Ambulatory Visit: Payer: Self-pay

## 2023-01-30 ENCOUNTER — Other Ambulatory Visit: Payer: Self-pay

## 2023-01-31 ENCOUNTER — Other Ambulatory Visit: Payer: Self-pay

## 2023-01-31 MED ORDER — SAXENDA 18 MG/3ML ~~LOC~~ SOPN
2.0000 mg | PEN_INJECTOR | Freq: Every day | SUBCUTANEOUS | 0 refills | Status: AC
  Filled 2023-01-31: qty 15, 30d supply, fill #0

## 2023-01-31 MED ORDER — INSULIN PEN NEEDLE 32G X 4 MM MISC
1.0000 | 3 refills | Status: AC
  Filled 2023-01-31: qty 100, 90d supply, fill #0

## 2023-02-06 ENCOUNTER — Other Ambulatory Visit: Payer: Self-pay

## 2023-03-05 ENCOUNTER — Other Ambulatory Visit: Payer: Self-pay

## 2023-04-02 ENCOUNTER — Other Ambulatory Visit: Payer: Self-pay

## 2023-05-26 ENCOUNTER — Other Ambulatory Visit: Payer: Self-pay

## 2023-06-06 ENCOUNTER — Telehealth: Payer: Self-pay | Admitting: Advanced Practice Midwife

## 2023-06-06 NOTE — Telephone Encounter (Signed)
I contacted the patient for scheduling adjustment with Erskine Squibb for 6/24. The patient was good to change to 8/19 with [facility].

## 2023-06-09 ENCOUNTER — Ambulatory Visit: Payer: Commercial Managed Care - PPO | Admitting: Advanced Practice Midwife

## 2023-07-08 ENCOUNTER — Other Ambulatory Visit: Payer: Self-pay

## 2023-07-10 ENCOUNTER — Encounter (HOSPITAL_COMMUNITY): Payer: Self-pay | Admitting: *Deleted

## 2023-07-17 ENCOUNTER — Other Ambulatory Visit: Payer: Self-pay

## 2023-08-03 NOTE — Progress Notes (Deleted)
PCP:  Margarette Canada, DO   No chief complaint on file.    HPI:      [name] is a 42 y.o. [id number] whose LMP was No LMP recorded., presents today for her annual examination.  Her menses are {norm/abn:715}, lasting {number: 22536} days.  Dysmenorrhea {dysmen:716}. She {does:18564} have intermenstrual bleeding.  Sex activity: {sex active: 315163}. Mirena placed 05/16/21 Last Pap: 05/16/21  Results were: no abnormalities /neg HPV DNA *** Hx of STDs: {STD hx:14358}  Last mammogram: never Results were: {norm/abn:13465} There is no FH of breast cancer. There is no FH of ovarian cancer. The patient {does:18564} do self-breast exams.  Tobacco use: {tob:20664} Alcohol use: {Alcohol:11675} No drug use.  Exercise: {exercise:31265}  She {does:18564} get adequate calcium and Vitamin D in her diet.  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    Past Surgical History:  Procedure Laterality Date   CESAREAN SECTION  2010   CHOLECYSTECTOMY  2005   LAPAROSCOPIC GASTRIC SLEEVE RESECTION N/A 12/12/2020   Procedure: LAPAROSCOPIC GASTRIC SLEEVE RESECTION;  Surgeon: Luretha Murphy, MD;  Location: [facility];  Service: General;  Laterality: N/A;   MASS EXCISION Right 11/24/2013   Procedure: EXCISION OF A RIGHT THYROID MASS;  Surgeon: Serena Colonel, MD;  Location: [facility];  Service: ENT;  Laterality: Right;   NECK DISSECTION     THYROIDECTOMY     UPPER GI ENDOSCOPY N/A 12/12/2020   Procedure: UPPER GI ENDOSCOPY;  Surgeon: Luretha Murphy, MD;  Location: [facility];  Service: General;  Laterality: N/A;    Family History  Problem Relation Age of Onset   Colon cancer Maternal Grandmother     Social History    Socioeconomic History   Marital status: Widowed    Spouse name: Not on file   Number of children: Not on file   Years of education: Not on file   Highest education level: Not on file  Occupational History   Not on file  Tobacco Use   Smoking status: Never   Smokeless tobacco: Never  Vaping Use   Vaping status: Never Used  Substance and Sexual Activity   Alcohol use: Not Currently    Alcohol/week: 1.0 standard drink of alcohol    Types: 1 Glasses of wine per week    Comment: occasional glass of wine   Drug use: No   Sexual activity: Yes    Birth control/protection: None  Other Topics Concern   Not on file  Social History Narrative   Not on file   Social Determinants of Health   Financial Resource Strain: Not on file  Food Insecurity: Not on file  Transportation Needs: Not on file  Physical Activity: Not on file  Stress: Not on file  Social Connections: Not on file  Intimate Partner Violence: Not on file     Current Outpatient Medications:    BIOTIN PO, Take 1 tablet by mouth daily., Disp: , Rfl:    buPROPion (WELLBUTRIN SR) 150 MG 12 hr tablet, TAKE 1 TABLET BY MOUTH TWICE DAILY, Disp: 60 tablet, Rfl: 11   buPROPion (WELLBUTRIN SR) 150 MG 12 hr tablet, 1 (one) Tablet ER 12HR by mouth two times daily, Disp: 60 tablet, Rfl: 11   buPROPion (  WELLBUTRIN SR) 150 MG 12 hr tablet, Take 1 tablet (150 mg total) by mouth 2 (two) times daily., Disp: 60 tablet, Rfl: 11   citalopram (CELEXA) 40 MG tablet, TAKE 1 TABLET BY MOUTH DAILY, Disp: 90 tablet, Rfl: 3   citalopram (CELEXA) 40 MG tablet, TAKE 1 TABLET BY MOUTH DAILYy, Disp: 90 tablet, Rfl: 3   clonazePAM (KLONOPIN) 0.5 MG tablet, Take 0.25 mg by mouth 2 (two) times daily as needed for anxiety., Disp: , Rfl:    COVID-19 At Home Antigen Test [facility] COVID-19 HOME TEST) KIT, Use as directed, Disp: 2 kit, Rfl: 0   ibuprofen (ADVIL) 200 MG tablet, Take 400-600 mg by mouth every 8 (eight) hours as needed (for pain.). (Patient  not taking: Reported on 05/16/2021), Disp: , Rfl:    Insulin Pen Needle 32G X 4 MM MISC, Inject 1 each into the skin as directed, Disp: 100 each, Rfl: 3   levothyroxine (SYNTHROID) 137 MCG tablet, Take 2 tablets by mouth daily, Disp: 180 tablet, Rfl: 1   levothyroxine (SYNTHROID) 137 MCG tablet, TAKE 2 TABLETS BY MOUTH DAILY, Disp: 180 tablet, Rfl: 1   levothyroxine (SYNTHROID) 175 MCG tablet, Take 1 tablet (175 mcg total) by mouth daily., Disp: 90 tablet, Rfl: 1   levothyroxine (SYNTHROID) 175 MCG tablet, Take 1 tablet (175 mcg total) by mouth daily., Disp: 90 tablet, Rfl: 1   metoprolol succinate (TOPROL-XL) 50 MG 24 hr tablet, Take 50 mg by mouth daily. (Patient not taking: Reported on 05/16/2021), Disp: , Rfl:    Multiple Vitamin (MULTIVITAMIN WITH MINERALS) TABS tablet, Take 1 tablet by mouth daily., Disp: , Rfl:    ondansetron (ZOFRAN-ODT) 4 MG disintegrating tablet, Take 1 tablet (4 mg total) by mouth every 6 (six) hours as needed for nausea or vomiting. (Patient not taking: Reported on 05/16/2021), Disp: 20 tablet, Rfl: 0   SAXENDA 18 MG/3ML SOPN, Inject 0.6 mg subcutaneously every day for weight loss, increase dose by 0.6 mg every week to a maximum of 3.0 mg daily (Week1-0.6 mg/day, Week2-1.2 mg/day, Week3-1.8 mg/day, Week4 2.4 mg/day, ZOXW9- 3.0 mg/day)., Disp: 15 mL, Rfl: 0   vitamin B-12 (CYANOCOBALAMIN) 1000 MCG tablet, Take 1,000 mcg by mouth daily., Disp: , Rfl:      ROS:  Review of Systems BREAST: No symptoms   Objective: There were no vitals taken for this visit.   OBGyn Exam  Results: No results found for this or any previous visit (from the past 24 hour(s)).  Assessment/Plan: No diagnosis found.  No orders of the defined types were placed in this encounter.            GYN counsel {counseling: 16159}     F/U  No follow-ups on file.  Sabrina Tempesta B. Deniz Hannan, PA-C 08/03/2023 4:03 PM

## 2023-08-04 ENCOUNTER — Ambulatory Visit: Payer: Commercial Managed Care - PPO | Admitting: Obstetrics and Gynecology

## 2023-08-04 DIAGNOSIS — Z124 Encounter for screening for malignant neoplasm of cervix: Secondary | ICD-10-CM

## 2023-08-04 DIAGNOSIS — Z01419 Encounter for gynecological examination (general) (routine) without abnormal findings: Secondary | ICD-10-CM

## 2023-08-04 DIAGNOSIS — Z30431 Encounter for routine checking of intrauterine contraceptive device: Secondary | ICD-10-CM

## 2023-08-04 DIAGNOSIS — Z1231 Encounter for screening mammogram for malignant neoplasm of breast: Secondary | ICD-10-CM

## 2023-08-04 DIAGNOSIS — Z1151 Encounter for screening for human papillomavirus (HPV): Secondary | ICD-10-CM

## 2023-11-28 ENCOUNTER — Ambulatory Visit: Payer: Commercial Managed Care - PPO | Admitting: Licensed Practical Nurse

## 2023-12-01 DIAGNOSIS — Z299 Encounter for prophylactic measures, unspecified: Secondary | ICD-10-CM | POA: Diagnosis not present

## 2023-12-01 DIAGNOSIS — Z79899 Other long term (current) drug therapy: Secondary | ICD-10-CM | POA: Diagnosis not present

## 2023-12-01 DIAGNOSIS — E039 Hypothyroidism, unspecified: Secondary | ICD-10-CM | POA: Diagnosis not present

## 2023-12-01 DIAGNOSIS — E559 Vitamin D deficiency, unspecified: Secondary | ICD-10-CM | POA: Diagnosis not present

## 2023-12-01 DIAGNOSIS — Z0000 Encounter for general adult medical examination without abnormal findings: Secondary | ICD-10-CM | POA: Diagnosis not present

## 2023-12-01 DIAGNOSIS — Z1331 Encounter for screening for depression: Secondary | ICD-10-CM | POA: Diagnosis not present

## 2023-12-01 DIAGNOSIS — E538 Deficiency of other specified B group vitamins: Secondary | ICD-10-CM | POA: Diagnosis not present

## 2023-12-01 DIAGNOSIS — I10 Essential (primary) hypertension: Secondary | ICD-10-CM | POA: Diagnosis not present

## 2023-12-01 DIAGNOSIS — E7800 Pure hypercholesterolemia, unspecified: Secondary | ICD-10-CM | POA: Diagnosis not present

## 2023-12-18 ENCOUNTER — Ambulatory Visit: Payer: Commercial Managed Care - PPO | Admitting: Licensed Practical Nurse

## 2023-12-31 ENCOUNTER — Encounter: Payer: Self-pay | Admitting: Licensed Practical Nurse

## 2024-01-01 ENCOUNTER — Other Ambulatory Visit: Payer: Self-pay

## 2024-01-01 MED ORDER — CITALOPRAM HYDROBROMIDE 40 MG PO TABS
40.0000 mg | ORAL_TABLET | Freq: Every day | ORAL | 3 refills | Status: DC
  Filled 2024-01-01: qty 90, 90d supply, fill #0
  Filled 2024-05-03: qty 90, 90d supply, fill #1
  Filled 2024-07-08 – 2024-09-06 (×2): qty 90, 90d supply, fill #2
  Filled 2024-11-18: qty 90, 90d supply, fill #3

## 2024-01-01 MED ORDER — LEVOTHYROXINE SODIUM 175 MCG PO TABS
175.0000 ug | ORAL_TABLET | Freq: Every day | ORAL | 1 refills | Status: DC
  Filled 2024-01-01: qty 90, 90d supply, fill #0
  Filled 2024-05-03: qty 90, 90d supply, fill #1

## 2024-01-01 MED ORDER — BUPROPION HCL ER (SR) 150 MG PO TB12
150.0000 mg | ORAL_TABLET | Freq: Two times a day (BID) | ORAL | 11 refills | Status: AC
  Filled 2024-01-01: qty 60, 30d supply, fill #0
  Filled 2024-03-08: qty 60, 30d supply, fill #1
  Filled 2024-05-03: qty 60, 30d supply, fill #2
  Filled 2024-07-08: qty 60, 30d supply, fill #3
  Filled 2024-09-06: qty 60, 30d supply, fill #4
  Filled 2024-10-12: qty 60, 30d supply, fill #5
  Filled 2024-11-10: qty 60, 30d supply, fill #6
  Filled 2024-12-11: qty 60, 30d supply, fill #7

## 2024-01-14 ENCOUNTER — Telehealth: Payer: Commercial Managed Care - PPO | Admitting: Physician Assistant

## 2024-01-14 DIAGNOSIS — R3989 Other symptoms and signs involving the genitourinary system: Secondary | ICD-10-CM | POA: Diagnosis not present

## 2024-01-14 MED ORDER — NITROFURANTOIN MONOHYD MACRO 100 MG PO CAPS: 100.0000 mg | ORAL_CAPSULE | Freq: Two times a day (BID) | ORAL | 0 refills | Status: AC

## 2024-01-14 NOTE — Patient Instructions (Signed)
Luane [employer], thank you for joining Piedad Climes, PA-C for today's virtual visit.  While this provider is not your primary care provider (PCP), if your PCP is located in our provider database this encounter information will be shared with them immediately following your visit.   A [facility] MyChart account gives you access to today's visit and all your visits, tests, and labs performed at [facility] " click here if you don't have a [facility] MyChart account or go to [email or web address]  Consent: (Patient) Sabrina [name] provided verbal consent for this virtual visit at the beginning of the encounter.  Current Medications:  Current Outpatient Medications:    BIOTIN PO, Take 1 tablet by mouth daily., Disp: , Rfl:    buPROPion (WELLBUTRIN SR) 150 MG 12 hr tablet, TAKE 1 TABLET BY MOUTH TWICE DAILY, Disp: 60 tablet, Rfl: 11   buPROPion (WELLBUTRIN SR) 150 MG 12 hr tablet, 1 (one) Tablet ER 12HR by mouth two times daily, Disp: 60 tablet, Rfl: 11   buPROPion (WELLBUTRIN SR) 150 MG 12 hr tablet, Take 1 tablet (150 mg total) by mouth 2 (two) times daily., Disp: 60 tablet, Rfl: 11   citalopram (CELEXA) 40 MG tablet, TAKE 1 TABLET BY MOUTH DAILY, Disp: 90 tablet, Rfl: 3   citalopram (CELEXA) 40 MG tablet, TAKE 1 TABLET BY MOUTH DAILYy, Disp: 90 tablet, Rfl: 3   clonazePAM (KLONOPIN) 0.5 MG tablet, Take 0.25 mg by mouth 2 (two) times daily as needed for anxiety., Disp: , Rfl:    COVID-19 At Home Antigen Test [facility] COVID-19 HOME TEST) KIT, Use as directed, Disp: 2 kit, Rfl: 0   ibuprofen (ADVIL) 200 MG tablet, Take 400-600 mg by mouth every 8 (eight) hours as needed (for pain.). (Patient not taking: Reported on 05/16/2021), Disp: , Rfl:    Insulin Pen Needle 32G X 4 MM MISC, Inject 1 each into the skin as directed, Disp: 100 each, Rfl: 3   levothyroxine (SYNTHROID) 137 MCG tablet, Take 2 tablets by mouth daily, Disp: 180 tablet, Rfl: 1   levothyroxine (SYNTHROID) 137 MCG  tablet, TAKE 2 TABLETS BY MOUTH DAILY, Disp: 180 tablet, Rfl: 1   levothyroxine (SYNTHROID) 175 MCG tablet, Take 1 tablet (175 mcg total) by mouth daily., Disp: 90 tablet, Rfl: 1   levothyroxine (SYNTHROID) 175 MCG tablet, Take 1 tablet (175 mcg total) by mouth daily., Disp: 90 tablet, Rfl: 1   metoprolol succinate (TOPROL-XL) 50 MG 24 hr tablet, Take 50 mg by mouth daily. (Patient not taking: Reported on 05/16/2021), Disp: , Rfl:    Multiple Vitamin (MULTIVITAMIN WITH MINERALS) TABS tablet, Take 1 tablet by mouth daily., Disp: , Rfl:    ondansetron (ZOFRAN-ODT) 4 MG disintegrating tablet, Take 1 tablet (4 mg total) by mouth every 6 (six) hours as needed for nausea or vomiting. (Patient not taking: Reported on 05/16/2021), Disp: 20 tablet, Rfl: 0   SAXENDA 18 MG/3ML SOPN, Inject 0.6 mg subcutaneously every day for weight loss, increase dose by 0.6 mg every week to a maximum of 3.0 mg daily (Week1-0.6 mg/day, Week2-1.2 mg/day, Week3-1.8 mg/day, Week4 2.4 mg/day, XBMW4- 3.0 mg/day)., Disp: 15 mL, Rfl: 0   vitamin B-12 (CYANOCOBALAMIN) 1000 MCG tablet, Take 1,000 mcg by mouth daily., Disp: , Rfl:    Medications ordered in this encounter:  No orders of the defined types were placed in this encounter.    *If you need refills on other medications prior to your next appointment, please contact your pharmacy*  Follow-Up:  Call back or seek an in-person evaluation if the symptoms worsen or if the condition fails to improve as anticipated.  [facility] [phone]  Other Instructions Your symptoms are consistent with a bladder infection, also called acute cystitis. Please take your antibiotic (Macrobid) as directed until all pills are gone.  Stay very well hydrated.  Consider a daily probiotic (Align, Culturelle, or Activia) to help prevent stomach upset caused by the antibiotic.  Taking a probiotic daily may also help prevent recurrent UTIs.  Also consider taking AZO (Phenazopyridine) tablets  to help decrease pain with urination.    Urinary Tract Infection A urinary tract infection (UTI) can occur any place along the urinary tract. The tract includes the kidneys, ureters, bladder, and urethra. A type of germ called bacteria often causes a UTI. UTIs are often helped with antibiotic medicine.  HOME CARE  If given, take antibiotics as told by your doctor. Finish them even if you start to feel better. Drink enough fluids to keep your pee (urine) clear or pale yellow. Avoid tea, drinks with caffeine, and bubbly (carbonated) drinks. Pee often. Avoid holding your pee in for a long time. Pee before and after having sex (intercourse). Wipe from front to back after you poop (bowel movement) if you are a woman. Use each tissue only once. GET HELP RIGHT AWAY IF:  You have back pain. You have lower belly (abdominal) pain. You have chills. You feel sick to your stomach (nauseous). You throw up (vomit). Your burning or discomfort with peeing does not go away. You have a fever. Your symptoms are not better in 3 days. MAKE SURE YOU:  Understand these instructions. Will watch your condition. Will get help right away if you are not doing well or get worse. Document Released: 05/20/2008 Document Revised: 08/26/2012 Document Reviewed: 07/02/2012 [facility] Patient Information 2015 [place], [place]. This information is not intended to replace advice given to you by your health care provider. Make sure you discuss any questions you have with your health care provider.    If you have been instructed to have an in-person evaluation today at a local Urgent Care facility, please use the link below. It will take you to a list of all of our available [facility], including address, phone number and hours of operation. Please do not delay care.  [facility]  If you or a family member do not have a primary care provider, use the link below to schedule a visit and establish care.  When you choose a [facility] primary care physician or advanced practice provider, you gain a long-term partner in health. Find a Primary Care Provider  Learn more about [facility]'s in-office and virtual care options: [facility] - Get Care Now

## 2024-01-14 NOTE — Progress Notes (Signed)
Virtual Visit Consent   Sabrina [name], you are scheduled for a virtual visit with a [facility] provider today. Just as with appointments in the office, your consent must be obtained to participate. Your consent will be active for this visit and any virtual visit you may have with one of our providers in the next 365 days. If you have a MyChart account, a copy of this consent can be sent to you electronically.  As this is a virtual visit, video technology does not allow for your provider to perform a traditional examination. This may limit your provider's ability to fully assess your condition. If your provider identifies any concerns that need to be evaluated in person or the need to arrange testing (such as labs, EKG, etc.), we will make arrangements to do so. Although advances in technology are sophisticated, we cannot ensure that it will always work on either your end or our end. If the connection with a video visit is poor, the visit may have to be switched to a telephone visit. With either a video or telephone visit, we are not always able to ensure that we have a secure connection.  By engaging in this virtual visit, you consent to the provision of healthcare and authorize for your insurance to be billed (if applicable) for the services provided during this visit. Depending on your insurance coverage, you may receive a charge related to this service.  I need to obtain your verbal consent now. Are you willing to proceed with your visit today? Sabrina [name] has provided verbal consent on 01/14/2024 for a virtual visit (video or telephone). Piedad Climes, [place]  Date: 01/14/2024 7:46 AM  Virtual Visit via Video Note   I, Piedad Climes, connected with  Sabrina [name]  ([id number], 17-Aug-1981) on 01/14/24 at  8:00 AM EST by a video-enabled telemedicine application and verified that I am speaking with the correct person using two identifiers.  Location: Patient: Virtual Visit Location  Patient: Home Provider: Virtual Visit Location Provider: Home Office   I discussed the limitations of evaluation and management by telemedicine and the availability of in person appointments. The patient expressed understanding and agreed to proceed.    History of Present Illness: Sabrina [name] is a 43 y.o. who identifies as a female who was assigned female at birth, and is being seen today for possible UTI. Endorses symptoms starting over the weekend  (4 days ago) with urinary urgency and frequency. Some low back pain but denies dysuria. Denies fever, chills, nausea or vomiting. Some suprapubic pressure. LMP at present.  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    Allergies:  Allergies  Allergen Reactions   Sulfa Antibiotics Nausea And Vomiting   Penicillins Rash   Medications:  Current Outpatient Medications:    nitrofurantoin, macrocrystal-monohydrate, (MACROBID) 100 MG capsule, Take 1 capsule (100 mg total) by mouth 2 (two) times daily., Disp: 10 capsule, Rfl: 0   BIOTIN PO, Take 1 tablet by mouth daily., Disp: , Rfl:    buPROPion (WELLBUTRIN SR) 150 MG 12 hr tablet, TAKE 1 TABLET BY MOUTH TWICE DAILY, Disp: 60 tablet, Rfl: 11   buPROPion (WELLBUTRIN SR) 150 MG 12 hr tablet,  1 (one) Tablet ER 12HR by mouth two times daily, Disp: 60 tablet, Rfl: 11   buPROPion (WELLBUTRIN SR) 150 MG 12 hr tablet, Take 1 tablet (150 mg total) by mouth 2 (two) times daily., Disp: 60 tablet, Rfl: 11   citalopram (CELEXA) 40 MG tablet, TAKE 1 TABLET BY MOUTH DAILY, Disp: 90 tablet, Rfl: 3   citalopram (CELEXA) 40 MG tablet, TAKE 1 TABLET BY MOUTH DAILYy, Disp: 90 tablet, Rfl: 3   clonazePAM (KLONOPIN)  0.5 MG tablet, Take 0.25 mg by mouth 2 (two) times daily as needed for anxiety., Disp: , Rfl:    COVID-19 At Home Antigen Test [facility] COVID-19 HOME TEST) KIT, Use as directed, Disp: 2 kit, Rfl: 0   ibuprofen (ADVIL) 200 MG tablet, Take 400-600 mg by mouth every 8 (eight) hours as needed (for pain.). (Patient not taking: Reported on 05/16/2021), Disp: , Rfl:    Insulin Pen Needle 32G X 4 MM MISC, Inject 1 each into the skin as directed, Disp: 100 each, Rfl: 3   levothyroxine (SYNTHROID) 137 MCG tablet, Take 2 tablets by mouth daily, Disp: 180 tablet, Rfl: 1   levothyroxine (SYNTHROID) 137 MCG tablet, TAKE 2 TABLETS BY MOUTH DAILY, Disp: 180 tablet, Rfl: 1   levothyroxine (SYNTHROID) 175 MCG tablet, Take 1 tablet (175 mcg total) by mouth daily., Disp: 90 tablet, Rfl: 1   levothyroxine (SYNTHROID) 175 MCG tablet, Take 1 tablet (175 mcg total) by mouth daily., Disp: 90 tablet, Rfl: 1   metoprolol succinate (TOPROL-XL) 50 MG 24 hr tablet, Take 50 mg by mouth daily. (Patient not taking: Reported on 05/16/2021), Disp: , Rfl:    Multiple Vitamin (MULTIVITAMIN WITH MINERALS) TABS tablet, Take 1 tablet by mouth daily., Disp: , Rfl:    ondansetron (ZOFRAN-ODT) 4 MG disintegrating tablet, Take 1 tablet (4 mg total) by mouth every 6 (six) hours as needed for nausea or vomiting. (Patient not taking: Reported on 05/16/2021), Disp: 20 tablet, Rfl: 0   SAXENDA 18 MG/3ML SOPN, Inject 0.6 mg subcutaneously every day for weight loss, increase dose by 0.6 mg every week to a maximum of 3.0 mg daily (Week1-0.6 mg/day, Week2-1.2 mg/day, Week3-1.8 mg/day, Week4 2.4 mg/day, ZOXW9- 3.0 mg/day)., Disp: 15 mL, Rfl: 0   vitamin B-12 (CYANOCOBALAMIN) 1000 MCG tablet, Take 1,000 mcg by mouth daily., Disp: , Rfl:   Observations/Objective: Patient is well-developed, well-nourished in no acute distress.  Resting comfortably at home.  Head is normocephalic, atraumatic.  No labored breathing. Speech is clear and coherent with logical  content.  Patient is alert and oriented at baseline.   Assessment and Plan: 1. Suspected UTI (Primary) - nitrofurantoin, macrocrystal-monohydrate, (MACROBID) 100 MG capsule; Take 1 capsule (100 mg total) by mouth 2 (two) times daily.  Dispense: 10 capsule; Refill: 0  Classic UTI symptoms with absence of alarm signs or symptoms. Prior history of UTI. Will treat empirically with Macrobid for suspected uncomplicated cystitis. Supportive measures and OTC medications reviewed. Strict in-person evaluation precautions discussed.    Follow Up Instructions: I discussed the assessment and treatment plan with the patient. The patient was provided an opportunity to ask questions and all were answered. The patient agreed with the plan and demonstrated an understanding of the instructions.  A copy of instructions were sent to the patient via MyChart unless otherwise noted below.   The patient was advised to call back or seek an in-person evaluation if the symptoms worsen or if the condition fails to improve as anticipated.    Sherley Bounds  Daphine Deutscher, PA-C

## 2024-05-04 ENCOUNTER — Other Ambulatory Visit: Payer: Self-pay

## 2024-05-17 DIAGNOSIS — N941 Unspecified dyspareunia: Secondary | ICD-10-CM | POA: Diagnosis not present

## 2024-05-17 DIAGNOSIS — R8290 Unspecified abnormal findings in urine: Secondary | ICD-10-CM | POA: Diagnosis not present

## 2024-05-17 DIAGNOSIS — I10 Essential (primary) hypertension: Secondary | ICD-10-CM | POA: Diagnosis not present

## 2024-05-17 DIAGNOSIS — N390 Urinary tract infection, site not specified: Secondary | ICD-10-CM | POA: Diagnosis not present

## 2024-05-17 DIAGNOSIS — Z299 Encounter for prophylactic measures, unspecified: Secondary | ICD-10-CM | POA: Diagnosis not present

## 2024-05-27 ENCOUNTER — Ambulatory Visit: Admitting: Obstetrics

## 2024-06-01 ENCOUNTER — Encounter: Payer: Self-pay | Admitting: Obstetrics

## 2024-07-08 ENCOUNTER — Encounter (HOSPITAL_COMMUNITY): Payer: Self-pay | Admitting: *Deleted

## 2024-07-08 ENCOUNTER — Other Ambulatory Visit: Payer: Self-pay

## 2024-07-08 MED ORDER — LEVOTHYROXINE SODIUM 175 MCG PO TABS
175.0000 ug | ORAL_TABLET | Freq: Every day | ORAL | 1 refills | Status: DC
  Filled 2024-08-04: qty 90, 90d supply, fill #0
  Filled 2024-11-10: qty 90, 90d supply, fill #1

## 2024-08-04 ENCOUNTER — Other Ambulatory Visit: Payer: Self-pay

## 2024-11-10 ENCOUNTER — Other Ambulatory Visit: Payer: Self-pay

## 2024-11-12 ENCOUNTER — Other Ambulatory Visit: Payer: Self-pay

## 2024-11-15 ENCOUNTER — Other Ambulatory Visit: Payer: Self-pay

## 2024-11-18 ENCOUNTER — Other Ambulatory Visit: Payer: Self-pay

## 2024-11-18 ENCOUNTER — Other Ambulatory Visit (HOSPITAL_COMMUNITY): Payer: Self-pay

## 2024-11-18 MED ORDER — BUPROPION HCL ER (SR) 150 MG PO TB12
150.0000 mg | ORAL_TABLET | Freq: Two times a day (BID) | ORAL | 2 refills | Status: AC
  Filled 2024-11-18: qty 60, 30d supply, fill #0
  Filled 2024-12-15 – 2025-01-10 (×2): qty 180, 90d supply, fill #0

## 2024-11-24 ENCOUNTER — Other Ambulatory Visit: Payer: Self-pay

## 2024-11-25 ENCOUNTER — Other Ambulatory Visit: Payer: Self-pay

## 2024-11-26 ENCOUNTER — Other Ambulatory Visit (HOSPITAL_COMMUNITY): Payer: Self-pay

## 2024-11-26 ENCOUNTER — Other Ambulatory Visit: Payer: Self-pay

## 2024-11-26 MED ORDER — CITALOPRAM HYDROBROMIDE 40 MG PO TABS: 40.0000 mg | ORAL_TABLET | Freq: Every day | ORAL | 3 refills | Status: AC

## 2024-11-26 MED ORDER — LEVOTHYROXINE SODIUM 175 MCG PO TABS: 175.0000 ug | ORAL_TABLET | Freq: Every day | ORAL | 1 refills | Status: AC

## 2024-11-26 MED ORDER — LEVOTHYROXINE SODIUM 175 MCG PO TABS
175.0000 ug | ORAL_TABLET | Freq: Every day | ORAL | 1 refills | Status: AC
  Filled 2024-11-26: qty 90, 90d supply, fill #0

## 2024-11-26 MED ORDER — LEVOTHYROXINE SODIUM 175 MCG PO TABS: 175.0000 ug | ORAL_TABLET | Freq: Every day | ORAL | 1 refills | Status: DC

## 2024-12-15 ENCOUNTER — Other Ambulatory Visit (HOSPITAL_COMMUNITY): Payer: Self-pay

## 2024-12-27 ENCOUNTER — Other Ambulatory Visit (HOSPITAL_COMMUNITY): Payer: Self-pay

## 2024-12-27 ENCOUNTER — Other Ambulatory Visit: Payer: Self-pay

## 2024-12-27 ENCOUNTER — Other Ambulatory Visit (HOSPITAL_BASED_OUTPATIENT_CLINIC_OR_DEPARTMENT_OTHER): Payer: Self-pay

## 2024-12-27 MED ORDER — CYANOCOBALAMIN 1000 MCG/ML IJ SOLN
INTRAMUSCULAR | 3 refills | Status: AC
  Filled 2024-12-27 – 2024-12-28 (×2): qty 15, 90d supply, fill #0

## 2024-12-27 MED ORDER — LEVOTHYROXINE SODIUM 150 MCG PO TABS
150.0000 ug | ORAL_TABLET | Freq: Every day | ORAL | 3 refills | Status: AC
  Filled 2024-12-27: qty 90, 90d supply, fill #0

## 2024-12-28 ENCOUNTER — Other Ambulatory Visit: Payer: Self-pay

## 2024-12-28 ENCOUNTER — Other Ambulatory Visit (HOSPITAL_COMMUNITY): Payer: Self-pay

## 2024-12-29 ENCOUNTER — Other Ambulatory Visit (HOSPITAL_COMMUNITY): Payer: Self-pay

## 2024-12-31 ENCOUNTER — Other Ambulatory Visit: Payer: Self-pay

## 2024-12-31 ENCOUNTER — Other Ambulatory Visit (HOSPITAL_COMMUNITY): Payer: Self-pay

## 2024-12-31 ENCOUNTER — Other Ambulatory Visit (HOSPITAL_BASED_OUTPATIENT_CLINIC_OR_DEPARTMENT_OTHER): Payer: Self-pay

## 2024-12-31 MED ORDER — "BD LUER-LOK SYRINGE 25G X 1-1/2"" 3 ML MISC"
1.0000 | 12 refills | Status: AC
  Filled 2024-12-31: qty 4, 28d supply, fill #0
  Filled 2025-01-21: qty 4, 28d supply, fill #1

## 2025-01-03 ENCOUNTER — Other Ambulatory Visit (HOSPITAL_COMMUNITY): Payer: Self-pay

## 2025-01-03 ENCOUNTER — Other Ambulatory Visit (HOSPITAL_BASED_OUTPATIENT_CLINIC_OR_DEPARTMENT_OTHER): Payer: Self-pay

## 2025-01-05 ENCOUNTER — Other Ambulatory Visit (HOSPITAL_COMMUNITY): Payer: Self-pay

## 2025-01-05 MED ORDER — CYANOCOBALAMIN 1000 MCG/ML IJ SOLN: 1000.0000 ug | INTRAMUSCULAR | 3 refills | Status: AC

## 2025-01-10 ENCOUNTER — Other Ambulatory Visit (HOSPITAL_COMMUNITY): Payer: Self-pay

## 2025-01-10 ENCOUNTER — Other Ambulatory Visit: Payer: Self-pay

## 2025-01-21 ENCOUNTER — Other Ambulatory Visit (HOSPITAL_COMMUNITY): Payer: Self-pay
